# Patient Record
Sex: Female | Born: 1945 | Race: Black or African American | Smoking: Never smoker
Health system: Southern US, Community
[De-identification: ages and names within clinical notes are randomized; demographics above are authoritative.]

## PROBLEM LIST (undated history)

## (undated) DIAGNOSIS — I10 Essential (primary) hypertension: Secondary | ICD-10-CM

## (undated) DIAGNOSIS — I00 Rheumatic fever without heart involvement: Secondary | ICD-10-CM

## (undated) DIAGNOSIS — R011 Cardiac murmur, unspecified: Secondary | ICD-10-CM

## (undated) DIAGNOSIS — M1990 Unspecified osteoarthritis, unspecified site: Secondary | ICD-10-CM

## (undated) DIAGNOSIS — Z8619 Personal history of other infectious and parasitic diseases: Secondary | ICD-10-CM

## (undated) DIAGNOSIS — E785 Hyperlipidemia, unspecified: Secondary | ICD-10-CM

## (undated) HISTORY — DX: Hyperlipidemia, unspecified: E78.5

## (undated) HISTORY — DX: Rheumatic fever without heart involvement: I00

## (undated) HISTORY — DX: Personal history of other infectious and parasitic diseases: Z86.19

## (undated) HISTORY — DX: Unspecified osteoarthritis, unspecified site: M19.90

## (undated) HISTORY — DX: Essential (primary) hypertension: I10

## (undated) HISTORY — DX: Cardiac murmur, unspecified: R01.1

---

## 2015-05-07 LAB — CBC AND DIFFERENTIAL
HEMATOCRIT: 28 — AB (ref 36–46)
HEMOGLOBIN: 9.4 — AB (ref 12.0–16.0)
Platelets: 172 (ref 150–399)
WBC: 14.5

## 2015-05-07 LAB — BASIC METABOLIC PANEL: GLUCOSE: 75

## 2015-05-08 LAB — HEPATIC FUNCTION PANEL
ALT: 14 (ref 7–35)
AST: 19 (ref 13–35)
Alkaline Phosphatase: 139 — AB (ref 25–125)

## 2017-04-12 LAB — HM PAP SMEAR

## 2017-07-07 ENCOUNTER — Other Ambulatory Visit: Payer: Self-pay | Admitting: Physician Assistant

## 2017-07-07 ENCOUNTER — Ambulatory Visit
Admission: RE | Admit: 2017-07-07 | Discharge: 2017-07-07 | Disposition: A | Discharge status: Home / Self Care | Payer: Medicare Other | Source: Ambulatory Visit | Attending: Physician Assistant | Admitting: Physician Assistant

## 2017-07-07 DIAGNOSIS — M25569 Pain in unspecified knee: Secondary | ICD-10-CM

## 2017-07-07 DIAGNOSIS — M25519 Pain in unspecified shoulder: Secondary | ICD-10-CM

## 2017-10-17 ENCOUNTER — Ambulatory Visit: Payer: Medicare Other | Admitting: Sports Medicine

## 2017-10-27 ENCOUNTER — Ambulatory Visit: Payer: Self-pay

## 2017-10-27 ENCOUNTER — Encounter: Payer: Self-pay | Admitting: Sports Medicine

## 2017-10-27 ENCOUNTER — Ambulatory Visit (INDEPENDENT_AMBULATORY_CARE_PROVIDER_SITE_OTHER): Payer: Medicare Other | Admitting: Sports Medicine

## 2017-10-27 VITALS — BP 150/102 | HR 60 | Ht 65.5 in | Wt 180.2 lb

## 2017-10-27 DIAGNOSIS — M25512 Pain in left shoulder: Secondary | ICD-10-CM | POA: Diagnosis not present

## 2017-10-27 DIAGNOSIS — M19012 Primary osteoarthritis, left shoulder: Secondary | ICD-10-CM | POA: Diagnosis not present

## 2017-10-27 DIAGNOSIS — M1712 Unilateral primary osteoarthritis, left knee: Secondary | ICD-10-CM | POA: Diagnosis not present

## 2017-10-27 DIAGNOSIS — G8929 Other chronic pain: Secondary | ICD-10-CM | POA: Diagnosis not present

## 2017-10-27 NOTE — Progress Notes (Addendum)
[name]. Kayleanna Lorman, [place] [facility] [place]  Elzie Sheets - 72 y.o. female MRN [id number]  Date of birth: 10-May-1945  Visit Date: 10/27/2017  PCP: [name], DO   Referred by: [name], DO   Scribe(s) for today's visit: [name], LAT, ATC  SUBJECTIVE:  Monique [name] is here for New Patient (Initial Visit) (Knee pain)    HPI: 10/27/2017 Her L knee and L shoulder pain symptoms INITIALLY: Began about a year ago w/ no known MOI.  Pt reports pain more to the medial aspect of her L knee. Described as moderate (5-7/10) aching, intermittent  pain, radiating to L lower leg Worsened with walking Improved with CBD oil Additional associated symptoms include: no L knee swelling noted and no mechanical symptoms noted    At this time symptoms are worsening compared to onset  She has been using CBD oil.  L shoulder: Her L shoulder pain symptoms INITIALLY: Began years ago w/ no known MOI.  Pt notes that the pain has increased over the past year and now has pain in the L superior shoulder, radiating into the L neck.  She states that she had a period of time in which her L shoulder was "frozen" but notes that her ROM has improved since that time. Described as moderate, aching, constant pain, radiating to L side of her neck Worsened with overhead  ROM, pushing or pulling activities w/ L UE Improved with CBD oil Additional associated symptoms include: No significant clicking popping or locking.    At this time symptoms are showing no change compared to onset She has been using CBD oil.  REVIEW OF SYSTEMS: Reports night time disturbances. Reports fevers, chills, or night sweats.  Yes to night sweats. Denies unexplained weight loss. Denies personal history of cancer. Denies changes in bowel or bladder habits. Denies recent unreported falls. Denies new or worsening dyspnea or wheezing. Denies headaches or  dizziness.  Denies numbness, tingling or weakness  In the extremities.  Denies dizziness or presyncopal episodes Denies lower extremity edema    HISTORY:  Prior history reviewed and updated per electronic medical record.  Social History   Occupational History  . Not on file  Tobacco Use  . Smoking status: Never Smoker  . Smokeless tobacco: Never Used  Substance and Sexual Activity  . Alcohol use: Not on file  . Drug use: Not on file  . Sexual activity: Not on file   Social History   Social History Narrative  . Not on file    History reviewed. No pertinent past medical history.  family history is not on file.  DATA OBTAINED & REVIEWED:  No results for input(s): HGBA1C, LABURIC, CREATINE in the last 8760 hours. . 07/07/2017: o X-Ray left shoulder  Moderate AC joint arthritis and slight irregularity of the greater tuberosity suggestive of prior rotator cuff tendinopathy. o 2 view x-rays bilateral knees show mild tricompartmental degenerative changes of the right knee with a posterior loose body and severe tricompartmental degenerative changes of the left knee with subluxation of the tibia small joint effusion. o Lumbar spine normal-appearing lumbar spine with the exception of atherosclerosis noted.  Age-appropriate degenerative changes but no significant evidence of osseous or soft tissue irregularity. .   OBJECTIVE:  VS:  HT:5' 5.5" (166.4 cm)   WT:180 lb 3.2 oz (81.7 kg)  BMI:29.52    BP:(!) 150/102  HR:60bpm  TEMP: ( )  RESP:  PHYSICAL EXAM: CONSTITUTIONAL: Well-developed, Well-nourished and In no acute distress PSYCHIATRIC: Alert & appropriately interactive. and Not depressed or anxious appearing. RESPIRATORY: No increased work of breathing and Trachea Midline EYES: Pupils are equal., EOM intact without nystagmus. and No scleral icterus.  VASCULAR EXAM: Warm and well perfused NEURO: unremarkable  MSK Exam: Left shoulder  Well aligned, no significant  deformity. No overlying skin changes. TTP over Generalized tenderness to palpation over the anterior shoulder.   RANGE OF MOTION & STRENGTH  Limited and painful: Overhead range of motion, forward flexion and abduction.   SPECIALITY TESTING:  Positive Hawkins and Neer's but strength is well-preserved and only minimally painful.  She has more pain with O'Brien's testing and more pain with axial load and circumduction with positive crepitation    Left knee  . Marked degenerative change.  Osteoarthritic bossing.  Mild effusion with marked generalized synovitis. . TTP over Medial and lateral joint lines per . No overlying skin changes.   RANGE OF MOTION & STRENGTH  . Extensor mechanism intact.  Knee arc from 3degrees to 100 degrees   LIGAMENTOUS TESTING  . Ligamentously stable to Varus and valgus strain with only 3 to 4 mm of opening with varus testing but solid endpoint.   SPECIALITY TESTING:  . Positive/Abnormal: McMurray's, positive patellar grind.    ASSESSMENT   1. Chronic left shoulder pain   2. Primary osteoarthritis of left knee   3. Primary osteoarthritis of left shoulder     PLAN:  Pertinent additional documentation may be included in corresponding procedure notes, imaging studies, problem based documentation and patient instructions.  Procedures:  . US Guided Injection per procedure note . Discussed the foundation of treatment for this condition is physical therapy and/or daily (5-6 days/week) therapeutic exercises, focusing on core strengthening, coordination, neuromuscular control/reeducation.  Therapeutic exercises prescribed per procedure note.  Medications:  No orders of the defined types were placed in this encounter.  Discussion/Instructions: No problem-specific Assessment & Plan notes found for this encounter.  Marland Kitchen Ultrasound-guided injection today for both the shoulder and Neer performed. . Both of these joints being painful is related to underlying  osteoarthritic changes.  Further diagnostic evaluation not recommended at this time and can consider repeat injections. . Discussed red flag symptoms that warrant earlier emergent evaluation and patient voices understanding. . Activity modifications and the importance of avoiding exacerbating activities (limiting pain to no more than a 4 / 10 during or following activity) recommended and discussed.  Follow-up:  . Return in about 10 weeks (around 01/05/2018).   .   . At follow up will plan to consider: repeat corticosteroid injections and Visco-supplementation Discussed      CMA/ATC served as scribe during this visit. History, Physical, and Plan performed by medical provider. Documentation and orders reviewed and attested to.      Gerda Diss, [facility] Physician

## 2017-10-27 NOTE — Progress Notes (Signed)

## 2017-10-27 NOTE — Procedures (Signed)
PROCEDURE NOTE:  Ultrasound Guided: Injection: Left shoulder Images were obtained and interpreted by myself, Teresa Coombs, DO  Images have been saved and stored to PACS system. Images obtained on: GE S7 Ultrasound machine    ULTRASOUND FINDINGS:  Marked degenerative changes  DESCRIPTION OF PROCEDURE:  The patient's clinical condition is marked by substantial pain and/or significant functional disability. Other conservative therapy has not provided relief, is contraindicated, or not appropriate. There is a reasonable likelihood that injection will significantly improve the patient's pain and/or functional impairment.   After discussing the risks, benefits and expected outcomes of the injection and all questions were reviewed and answered, the patient wished to undergo the above named procedure.  Verbal consent was obtained.  The ultrasound was used to identify the target structure and adjacent neurovascular structures. The skin was then prepped in sterile fashion and the target structure was injected under direct visualization using sterile technique as below:  Single injection performed as below: PREP: Alcohol and Ethel Chloride APPROACH:superiolateral, single injection, 21g 2 in. INJECTATE: 2 cc 0.5% Marcaine and 1 cc 40mg /mL DepoMedrol ASPIRATE: None DRESSING: Band-Aid  Post procedural instructions including recommending icing and warning signs for infection were reviewed.    This procedure was well tolerated and there were no complications.   IMPRESSION: Succesful Ultrasound Guided: Injection

## 2017-10-27 NOTE — Patient Instructions (Signed)

## 2017-10-27 NOTE — Procedures (Signed)
PROCEDURE NOTE:  Ultrasound Guided: Injection: Left knee Images were obtained and interpreted by myself, Teresa Coombs, DO  Images have been saved and stored to PACS system. Images obtained on: GE S7 Ultrasound machine    ULTRASOUND FINDINGS:  osteoarthritic changes.  Small effusion.  DESCRIPTION OF PROCEDURE:  The patient's clinical condition is marked by substantial pain and/or significant functional disability. Other conservative therapy has not provided relief, is contraindicated, or not appropriate. There is a reasonable likelihood that injection will significantly improve the patient's pain and/or functional impairment.   After discussing the risks, benefits and expected outcomes of the injection and all questions were reviewed and answered, the patient wished to undergo the above named procedure.  Verbal consent was obtained.  The ultrasound was used to identify the target structure and adjacent neurovascular structures. The skin was then prepped in sterile fashion and the target structure was injected under direct visualization using sterile technique as below:  Single injection performed as below: PREP: Alcohol and Ethel Chloride APPROACH:posterior, single injection, 21g 2 in. INJECTATE: 2 cc 0.5% Marcaine and 2 cc 40mg /mL DepoMedrol ASPIRATE: None DRESSING: Band-Aid  Post procedural instructions including recommending icing and warning signs for infection were reviewed.    This procedure was well tolerated and there were no complications.   IMPRESSION: Succesful Ultrasound Guided: Injection

## 2017-12-07 LAB — HEPATIC FUNCTION PANEL
ALT: 33 (ref 7–35)
AST: 21 (ref 13–35)
Alkaline Phosphatase: 72 (ref 25–125)

## 2017-12-07 LAB — CBC AND DIFFERENTIAL
HEMATOCRIT: 33 — AB (ref 36–46)
HEMOGLOBIN: 10.8 — AB (ref 12.0–16.0)
PLATELETS: 294 (ref 150–399)
WBC: 6.9

## 2017-12-07 LAB — BASIC METABOLIC PANEL: Glucose: 84

## 2017-12-29 ENCOUNTER — Ambulatory Visit (INDEPENDENT_AMBULATORY_CARE_PROVIDER_SITE_OTHER): Payer: Medicare Other | Admitting: Sports Medicine

## 2017-12-29 ENCOUNTER — Encounter: Payer: Self-pay | Admitting: Sports Medicine

## 2017-12-29 ENCOUNTER — Ambulatory Visit (INDEPENDENT_AMBULATORY_CARE_PROVIDER_SITE_OTHER): Payer: Medicare Other

## 2017-12-29 VITALS — BP 158/88 | Ht 65.5 in | Wt 180.2 lb

## 2017-12-29 DIAGNOSIS — M25512 Pain in left shoulder: Secondary | ICD-10-CM

## 2017-12-29 DIAGNOSIS — M1712 Unilateral primary osteoarthritis, left knee: Secondary | ICD-10-CM

## 2017-12-29 DIAGNOSIS — M479 Spondylosis, unspecified: Secondary | ICD-10-CM | POA: Diagnosis not present

## 2017-12-29 DIAGNOSIS — M19012 Primary osteoarthritis, left shoulder: Secondary | ICD-10-CM | POA: Insufficient documentation

## 2017-12-29 DIAGNOSIS — G8929 Other chronic pain: Secondary | ICD-10-CM

## 2017-12-29 DIAGNOSIS — M19011 Primary osteoarthritis, right shoulder: Secondary | ICD-10-CM | POA: Diagnosis not present

## 2017-12-29 MED ORDER — CELECOXIB 100 MG PO CAPS: 100.0000 mg | ORAL_CAPSULE | Freq: Two times a day (BID) | ORAL | 2 refills | Status: DC | PRN

## 2017-12-29 NOTE — Progress Notes (Signed)
Monique [name]. , [facility] [place]  Monique [name] - 72 y.o. female MRN [id number]  Date of birth: October 05, 1945  Visit Date: 12/29/2017  PCP: [name], MD   Referred by: [name], DO   Scribe(s) for today's visit: [name], LAT, ATC  SUBJECTIVE:  Monique [name] is here for Follow-up (L shoulder and L knee pain)   HPI: 10/27/2017 Her L knee and L shoulder pain symptoms INITIALLY: Began about a year ago w/ no known MOI.  Pt reports pain more to the medial aspect of her L knee. Described as moderate (5-7/10) aching, intermittent  pain, radiating to L lower leg Worsened with walking Improved with CBD oil Additional associated symptoms include: no L knee swelling noted and no mechanical symptoms noted    At this time symptoms are worsening compared to onset  She has been using CBD oil.  L shoulder: Her L shoulder pain symptoms INITIALLY: Began years ago w/ no known MOI.  Pt notes that the pain has increased over the past year and now has pain in the L superior shoulder, radiating into the L neck.  She states that she had a period of time in which her L shoulder was "frozen" but notes that her ROM has improved since that time. Described as moderate, aching, constant pain, radiating to L side of her neck Worsened with overhead  ROM, pushing or pulling activities w/ L UE Improved with CBD oil Additional associated symptoms include: No significant clicking popping or locking.    At this time symptoms are showing no change compared to onset She has been using CBD oil.  12/29/2017: L knee Compared to the last office visit on 10/27/17, her previously described L knee pain symptoms are improving. Current symptoms are mild pain that comes and goes & are nonradiating She has been doing her HEP consisting of quad sets, SLR and hip aBd a few times/week .  She had a L knee steroid injection at her last visit  and feels that this was beneficial.  L shoulder Compared to the last office visit on 10/27/17, her previously described L shoulder symptoms are worsening and reports pain along the L side of her neck and along her L UT. Current symptoms are moderate, aching constant pain & are radiating to L neck and upper trap. She has been using CBD.  She had a L shoulder steroid injection at her last visit and feels that this did not help her symtpoms.  REVIEW OF SYSTEMS: Reports night time disturbances. Reports fevers, chills, or night sweats.  Yes to night sweats. Denies unexplained weight loss. Denies personal history of cancer. Denies changes in bowel or bladder habits. Denies recent unreported falls. Denies new or worsening dyspnea or wheezing. Denies headaches or dizziness.  Denies numbness, tingling or weakness  In the extremities.  Denies dizziness or presyncopal episodes Denies lower extremity edema    HISTORY:  Prior history reviewed and updated per electronic medical record.  Social History   Occupational History  . Not on file  Tobacco Use  . Smoking status: Never Smoker  . Smokeless tobacco: Never Used  Substance and Sexual Activity  . Alcohol use: Yes  . Drug use: Never  . Sexual activity: Yes   Social History   Social History Narrative  . Not on file    Past Medical History:  Diagnosis Date  . Arthritis   . Heart murmur   . History  of chicken pox   . Hyperlipidemia   . Hypertension   . Rheumatic fever     family history includes Alcohol abuse in her father and mother.  DATA OBTAINED & REVIEWED:   Recent Labs    01/25/18 1545  HGBA1C 5.9   . 07/07/2017: o X-Ray left shoulder  Moderate AC joint arthritis and slight irregularity of the greater tuberosity suggestive of prior rotator cuff tendinopathy. o 2 view x-rays bilateral knees show mild tricompartmental degenerative changes of the right knee with a posterior loose body and severe tricompartmental  degenerative changes of the left knee with subluxation of the tibia small joint effusion. o Lumbar spine normal-appearing lumbar spine with the exception of atherosclerosis noted.  Age-appropriate degenerative changes but no significant evidence of osseous or soft tissue irregularity. .   OBJECTIVE:  VS:  HT:5' 5.5" (166.4 cm)   WT:180 lb 3.2 oz (81.7 kg)  BMI:29.52    BP:(!) 158/88  HR: bpm  TEMP: ( )  RESP:    PHYSICAL EXAM: CONSTITUTIONAL: Well-developed, Well-nourished and In no acute distress PSYCHIATRIC: Alert & appropriately interactive. and Not depressed or anxious appearing. RESPIRATORY: No increased work of breathing and Trachea Midline EYES: Pupils are equal., EOM intact without nystagmus. and No scleral icterus.  VASCULAR EXAM: Warm and well perfused NEURO: unremarkable  MSK Exam: Cervical range of motion is slightly limited with side bending and rotation.  There is a small amount of pain with brachial plexus squeeze.  Her intrinsic rotator cuff strength is intact bilaterally.  Small amount of pain with axial load and circumduction of bilateral shoulders.  Slight AC joint pain with direct palpation.  Her knees are improved without significant effusion today.  Ligamentously stable.  Extensor mechanism intact.   Cervical spine x-rays obtained today that do show straightening of the cervical lordosis with mild multilevel degenerative changes.  ASSESSMENT   1. Primary osteoarthritis of both shoulders   2. Acute pain of left shoulder   3. Chronic left shoulder pain   4. Primary osteoarthritis of left knee      PROCEDURES:  None  PLAN:  Pertinent additional documentation may be included in corresponding procedure notes, imaging studies, problem based documentation and patient instructions.  She has better from the injections however is continuing to have generalized pain.  Some of this is likely coming from cervical spondylosis as well as generalized arthritis.     Prescription for Celebrex provided today.  Continue previously prescribed home exercise program.   Activity modifications and the importance of avoiding exacerbating activities (limiting pain to no more than a 4 / 10 during or following activity) recommended and discussed.  Discussed red flag symptoms that warrant earlier emergent evaluation and patient voices understanding.  Return in about 6 weeks (around 02/09/2018).     CMA/ATC served as Education administrator during this visit. History, Physical, and Plan performed by medical provider. Documentation and orders reviewed and attested to.      Gerda Diss, [facility] Physician

## 2018-01-19 ENCOUNTER — Ambulatory Visit: Payer: Medicare Other | Admitting: Family Medicine

## 2018-01-24 ENCOUNTER — Encounter: Payer: Self-pay | Admitting: Family Medicine

## 2018-01-24 ENCOUNTER — Ambulatory Visit (INDEPENDENT_AMBULATORY_CARE_PROVIDER_SITE_OTHER): Payer: Medicare Other | Admitting: Family Medicine

## 2018-01-24 VITALS — BP 156/104 | HR 76 | Temp 97.9°F | Ht 65.5 in | Wt 180.8 lb

## 2018-01-24 DIAGNOSIS — E559 Vitamin D deficiency, unspecified: Secondary | ICD-10-CM

## 2018-01-24 DIAGNOSIS — N959 Unspecified menopausal and perimenopausal disorder: Secondary | ICD-10-CM | POA: Diagnosis not present

## 2018-01-24 DIAGNOSIS — E782 Mixed hyperlipidemia: Secondary | ICD-10-CM | POA: Diagnosis not present

## 2018-01-24 DIAGNOSIS — I10 Essential (primary) hypertension: Secondary | ICD-10-CM

## 2018-01-24 DIAGNOSIS — E785 Hyperlipidemia, unspecified: Secondary | ICD-10-CM | POA: Insufficient documentation

## 2018-01-24 DIAGNOSIS — Z1231 Encounter for screening mammogram for malignant neoplasm of breast: Secondary | ICD-10-CM

## 2018-01-24 DIAGNOSIS — Z1211 Encounter for screening for malignant neoplasm of colon: Secondary | ICD-10-CM

## 2018-01-24 DIAGNOSIS — Z1159 Encounter for screening for other viral diseases: Secondary | ICD-10-CM

## 2018-01-24 LAB — LIPID PANEL
CHOL/HDL RATIO: 3
Cholesterol: 262 mg/dL — ABNORMAL HIGH (ref 0–200)
HDL: 88.5 mg/dL (ref >39.00)
LDL Cholesterol: 154 mg/dL — ABNORMAL HIGH (ref 0–99)
NONHDL: 173.27
Triglycerides: 97 mg/dL (ref 0.0–149.0)
VLDL: 19.4 mg/dL (ref 0.0–40.0)

## 2018-01-24 LAB — CBC WITH DIFFERENTIAL/PLATELET
BASOS PCT: 1 % (ref 0.0–3.0)
Basophils Absolute: 0 10*3/uL (ref 0.0–0.1)
EOS PCT: 2.9 % (ref 0.0–5.0)
Eosinophils Absolute: 0.1 10*3/uL (ref 0.0–0.7)
HEMATOCRIT: 43.3 % (ref 36.0–46.0)
HEMOGLOBIN: 14.6 g/dL (ref 12.0–15.0)
Lymphocytes Relative: 39.9 % (ref 12.0–46.0)
Lymphs Abs: 1.6 10*3/uL (ref 0.7–4.0)
MCHC: 33.7 g/dL (ref 30.0–36.0)
MCV: 91.8 fl (ref 78.0–100.0)
MONO ABS: 0.4 10*3/uL (ref 0.1–1.0)
Monocytes Relative: 10.8 % (ref 3.0–12.0)
NEUTROS ABS: 1.9 10*3/uL (ref 1.4–7.7)
Neutrophils Relative %: 45.4 % (ref 43.0–77.0)
PLATELETS: 204 10*3/uL (ref 150.0–400.0)
RBC: 4.72 Mil/uL (ref 3.87–5.11)
RDW: 14.6 % (ref 11.5–15.5)
WBC: 4.1 10*3/uL (ref 4.0–10.5)

## 2018-01-24 LAB — COMPREHENSIVE METABOLIC PANEL
ALBUMIN: 4 g/dL (ref 3.5–5.2)
ALT: 13 U/L (ref 0–35)
AST: 12 U/L (ref 0–37)
Alkaline Phosphatase: 68 U/L (ref 39–117)
BUN: 18 mg/dL (ref 6–23)
CHLORIDE: 106 meq/L (ref 96–112)
CO2: 28 meq/L (ref 19–32)
Calcium: 9.7 mg/dL (ref 8.4–10.5)
Creatinine, Ser: 0.9 mg/dL (ref 0.40–1.20)
GFR: 79.49 mL/min (ref >60.00)
Glucose, Bld: 109 mg/dL — ABNORMAL HIGH (ref 70–99)
POTASSIUM: 4.6 meq/L (ref 3.5–5.1)
SODIUM: 141 meq/L (ref 135–145)
Total Bilirubin: 0.5 mg/dL (ref 0.2–1.2)
Total Protein: 6.4 g/dL (ref 6.0–8.3)

## 2018-01-24 LAB — MICROALBUMIN / CREATININE URINE RATIO
Creatinine,U: 115.3 mg/dL
MICROALB UR: 0.7 mg/dL (ref 0.0–1.9)
Microalb Creat Ratio: 0.6 mg/g (ref 0.0–30.0)

## 2018-01-24 LAB — VITAMIN D 25 HYDROXY (VIT D DEFICIENCY, FRACTURES): VITD: 30.35 ng/mL (ref 30.00–100.00)

## 2018-01-24 LAB — TSH: TSH: 2.16 u[IU]/mL (ref 0.35–4.50)

## 2018-01-24 MED ORDER — AMLODIPINE BESYLATE 10 MG PO TABS: 10.0000 mg | ORAL_TABLET | Freq: Every day | ORAL | 3 refills | Status: AC

## 2018-01-24 NOTE — Patient Instructions (Signed)
Wean off metoprolol -take 1/2 pill daily for 2 weeks then 1/2 pill every other day for a week then stop.   We are going to start you on norvasc 10mg . I would start with 1/2 of a norvasc for 2 weeks and if blood pressure is still >140/90 would increase this to the full pill.    See you back in 1 month for recheck.

## 2018-01-24 NOTE — Progress Notes (Signed)
Patient: Monique [name] MRN: [id number] DOB: 1945-12-22 PCP: [name], MD     Subjective:  Chief Complaint  Patient presents with  . Establish Care  . Hypertension  . Hyperlipidemia    HPI: The patient is a 72 y.o. female who presents today for transfer of care and to follow up for chronic medical problems. She has medical history of HTN, vitamin D deficiency and hyperlipidemia. She   Hypertension: Here for follow up of hypertension.  Currently on metoprolol .  Home readings are 150/100. Takes medication as prescribed and denies any side effects. Exercise includes none, but is going to try to get back into first of year. Weight has been stable. Denies any chest pain, headaches, shortness of breath, vision changes, swelling in lower extremities.   Vitamin D deficiency: she had checked in July and it was still low. Only put on otc medication. Long hx of deficiency.   Hyperlipidemia: she has hx of elevated cholesterol. Can not tolerate statins. No family hx of CAD/heart disease. She does not smoke and does not have diabetes. She has not ever tried zetia.    Review of Systems  Constitutional: Positive for fatigue. Negative for chills and fever.  HENT: Negative for dental problem, ear pain, hearing loss and trouble swallowing.   Eyes: Negative for visual disturbance.  Respiratory: Negative for cough, chest tightness and shortness of breath.   Cardiovascular: Negative for chest pain, palpitations and leg swelling.  Gastrointestinal: Negative for abdominal pain, blood in stool, diarrhea and nausea.  Endocrine: Negative for cold intolerance, polydipsia, polyphagia and polyuria.  Genitourinary: Negative for dysuria and hematuria.  Musculoskeletal: Negative for arthralgias, back pain and neck pain.  Skin: Negative.  Negative for rash.  Neurological: Negative for dizziness and headaches.  Psychiatric/Behavioral: Negative for dysphoric mood and sleep disturbance. The patient is not  nervous/anxious.     Allergies Patient has No Known Allergies.  Past Medical History Patient  has a past medical history of Hyperlipidemia and Hypertension.  Surgical History Patient  has no past surgical history on file.  Family History Pateint's family history is not on file.  Social History Patient  reports that she has never smoked. She has never used smokeless tobacco.    Objective: Vitals:   01/24/18 0820 01/24/18 0900  BP: (!) 158/100 (!) 156/104  Pulse: 76   Temp: 97.9 F (36.6 C)   TempSrc: Oral   SpO2: 98%   Weight: 180 lb 12.8 oz (82 kg)   Height: 5' 5.5" (1.664 m)     Body mass index is 29.63 kg/m.  Physical Exam Vitals signs reviewed.  Constitutional:      Appearance: She is well-developed.  HENT:     Right Ear: External ear normal.     Left Ear: External ear normal.  Eyes:     Conjunctiva/sclera: Conjunctivae normal.     Pupils: Pupils are equal, round, and reactive to light.  Neck:     Musculoskeletal: Normal range of motion and neck supple.     Thyroid: No thyromegaly.  Cardiovascular:     Rate and Rhythm: Normal rate and regular rhythm.     Heart sounds: Normal heart sounds. No murmur.  Pulmonary:     Effort: Pulmonary effort is normal.     Breath sounds: Normal breath sounds.  Abdominal:     General: Bowel sounds are normal. There is no distension.     Palpations: Abdomen is soft.     Tenderness: There is no abdominal tenderness.  Lymphadenopathy:     Cervical: No cervical adenopathy.  Skin:    General: Skin is warm and dry.     Findings: No rash.  Neurological:     Mental Status: She is alert and oriented to person, place, and time.     Cranial Nerves: No cranial nerve deficit.     Coordination: Coordination normal.     Deep Tendon Reflexes: Reflexes normal.  Psychiatric:        Behavior: Behavior normal.    Depression screen PHQ 2/9 01/24/2018  Decreased Interest 0  Down, Depressed, Hopeless 0  PHQ - 2 Score 0        Assessment/plan: 1. Screening for colon cancer  - Ambulatory referral to Gastroenterology  2. Encounter for screening mammogram for breast cancer  - MM Digital Screening; Future  3. Essential hypertension She is way above goal. She has been on a beta blocker for years and is on low dose. Discussed this is 4th line and could be causing her fatigue. We are going to wean her off of this and start her on norvasc. She will f/u in one month. Routine lab work today. Side effects of medication discussed. Let me know if any issues.  - Microalbumin / creatinine urine ratio - Lipid panel - Comprehensive metabolic panel - CBC with Differential/Platelet - TSH  4. Mixed hyperlipidemia Will check today. She is fasting. Could do trial of zetia. Will calculate her ASCVD. - Lipid panel  5. Vitamin D deficiency  - VITAMIN D 25 Hydroxy (Vit-D Deficiency, Fractures)     6. Encounter for hepatitis C screening test for low risk patient  - Hepatitis C antibody  7. Menopausal and postmenopausal disorder  - DG Bone Density; Future   Return in about 1 month (around 02/24/2018) for htn check .    [name], MD [facility]   01/24/2018

## 2018-01-25 ENCOUNTER — Other Ambulatory Visit (INDEPENDENT_AMBULATORY_CARE_PROVIDER_SITE_OTHER): Payer: Medicare Other

## 2018-01-25 ENCOUNTER — Other Ambulatory Visit: Payer: Self-pay | Admitting: Family Medicine

## 2018-01-25 ENCOUNTER — Other Ambulatory Visit: Payer: Medicare Other

## 2018-01-25 DIAGNOSIS — R7309 Other abnormal glucose: Secondary | ICD-10-CM

## 2018-01-25 LAB — HEMOGLOBIN A1C: Hgb A1c MFr Bld: 5.9 % (ref 4.6–6.5)

## 2018-01-25 LAB — HEPATITIS C ANTIBODY
HEP C AB: NONREACTIVE
SIGNAL TO CUT-OFF: 0.03 (ref <1.00)

## 2018-01-26 ENCOUNTER — Encounter: Payer: Self-pay | Admitting: Family Medicine

## 2018-01-26 DIAGNOSIS — R7303 Prediabetes: Secondary | ICD-10-CM | POA: Insufficient documentation

## 2018-01-30 DIAGNOSIS — E669 Obesity, unspecified: Secondary | ICD-10-CM | POA: Insufficient documentation

## 2018-02-09 ENCOUNTER — Encounter: Payer: Self-pay | Admitting: Sports Medicine

## 2018-02-09 ENCOUNTER — Ambulatory Visit (INDEPENDENT_AMBULATORY_CARE_PROVIDER_SITE_OTHER): Payer: Medicare Other | Admitting: Sports Medicine

## 2018-02-09 VITALS — BP 124/88 | HR 84 | Ht 65.5 in | Wt 181.0 lb

## 2018-02-09 DIAGNOSIS — M19011 Primary osteoarthritis, right shoulder: Secondary | ICD-10-CM | POA: Diagnosis not present

## 2018-02-09 DIAGNOSIS — M1712 Unilateral primary osteoarthritis, left knee: Secondary | ICD-10-CM

## 2018-02-09 DIAGNOSIS — G8929 Other chronic pain: Secondary | ICD-10-CM

## 2018-02-09 DIAGNOSIS — M25512 Pain in left shoulder: Secondary | ICD-10-CM | POA: Diagnosis not present

## 2018-02-09 DIAGNOSIS — M19012 Primary osteoarthritis, left shoulder: Secondary | ICD-10-CM

## 2018-02-09 MED ORDER — CELECOXIB 100 MG PO CAPS: 100.0000 mg | ORAL_CAPSULE | Freq: Two times a day (BID) | ORAL | 2 refills | Status: DC | PRN

## 2018-02-09 NOTE — Progress Notes (Signed)
Monique [name]. , [place] at [place]  Monique [name] - 73 y.o. female MRN [id number]  Date of birth: 12-Jan-1946  Visit Date: 02/09/2018    PCP: [name], MD   Referred by: [name], DO   SUBJECTIVE:  Chief Complaint  Patient presents with  . f/u L shoulder    XR c-spine 12/29/2017, XR L shoulder 07/07/2017. Received L shoulder injection 10/27/2017. Taking Celebrex with goof relief.    HPI: Patient is here for follow-up after left intra-articular shoulder injection as above. She has had good improvement in the pain that she was having but continues to have difficulty with reaching back.  She continues to take Celebrex intermittently and is doing well with this.  She has shoulder pain is improved with rest and overhead reaching.  REVIEW OF SYSTEMS: Denies fevers, chills, recent weight gain or weight loss.  No night sweats. No significant nighttime awakenings due to this issue.  HISTORY:  Prior history reviewed and updated per electronic medical record.  Social History   Occupational History  . Not on file  Tobacco Use  . Smoking status: Never Smoker  . Smokeless tobacco: Never Used  Substance and Sexual Activity  . Alcohol use: Yes  . Drug use: Never  . Sexual activity: Yes   Social History   Social History Narrative  . Not on file      DATA OBTAINED & REVIEWED:  Recent Labs    12/07/17 01/24/18 0925 01/25/18 1545  HGBA1C  --   --  5.9  CALCIUM  --  9.7  --   AST 21 12  --   ALT 33 13  --   TSH  --  2.16  --    No problems updated. No specialty comments available.  OBJECTIVE:  VS:  HT:5' 5.5" (166.4 cm)   WT:181 lb (82.1 kg)  BMI:29.65    BP:124/88  HR:84bpm  TEMP: ( )  RESP:    PHYSICAL EXAM: Adult female.  No acute distress.  Alert and appropriate. She has improved overhead range of motion of the shoulders.  She has had decreased crepitation and there is improvement  with this with axial load and circumduction.  There is no pain with this motion. Intrinsic rotator cuff strength is intact.  Left knee has generalized osteoarthritic bossing but this is mild.  Ligamentously stable.  No significant effusion.   ASSESSMENT  1. Acute pain of left shoulder   2. Primary osteoarthritis of left knee   3. Primary osteoarthritis of left shoulder   4. Chronic left shoulder pain   5. Primary osteoarthritis of both shoulders     PLAN:  Pertinent additional documentation may be included in corresponding procedure notes, imaging studies, problem based documentation and patient instructions.  Procedures:  None  Medications:  Meds ordered this encounter  Medications  . celecoxib (CELEBREX) 100 MG capsule    Sig: Take 1 capsule (100 mg total) by mouth 2 (two) times daily as needed.    Dispense:  60 capsule    Refill:  2    Discussion/Instructions: No problem-specific Assessment & Plan notes found for this encounter.  She is done quite well with the Celebrex at this time and feels comfortable with continue with with this.  She understands that if she has any type of acute flareup and happy to reinject her knee at any time.  She has been using CBD oil as needed has been able  to cut back on this as well as the Celebrex with appropriate activity modifications.  Given the chronic use of NSAIDs and other comorbid conditions I am happy to follow this for her and will plan to follow-up in 6 months for CMET and CBC.  At follow up will plan: repeat corticosteroid injections Return in about 6 months (around 08/10/2018) for repeat blood work.          Gerda Diss, [facility] Physician

## 2018-02-18 ENCOUNTER — Encounter: Payer: Self-pay | Admitting: Sports Medicine

## 2018-02-26 ENCOUNTER — Ambulatory Visit: Payer: Medicare Other | Admitting: Family Medicine

## 2018-02-26 NOTE — Progress Notes (Deleted)
Patient: Monique [name] MRN: [id number] DOB: [birth year] PCP: [name], MD     Subjective:  No chief complaint on file.   HPI: The patient is a 73 y.o. female who presents today for ***  Review of Systems  Allergies Patient is allergic to atorvastatin.  Past Medical History Patient  has a past medical history of Arthritis, Heart murmur, History of chicken pox, Hyperlipidemia, Hypertension, and Rheumatic fever.  Surgical History Patient  has no past surgical history on file.  Family History Pateint's family history includes Alcohol abuse in her father and mother.  Social History Patient  reports that she has never smoked. She has never used smokeless tobacco. She reports current alcohol use. She reports that she does not use drugs.    Objective: There were no vitals filed for this visit.  There is no height or weight on file to calculate BMI.  Physical Exam     Assessment/plan:      No follow-ups on file.     [name], MD [place]  02/26/2018

## 2018-03-01 ENCOUNTER — Ambulatory Visit (INDEPENDENT_AMBULATORY_CARE_PROVIDER_SITE_OTHER): Payer: Medicare Other | Admitting: Family Medicine

## 2018-03-01 ENCOUNTER — Encounter: Payer: Self-pay | Admitting: Family Medicine

## 2018-03-01 ENCOUNTER — Encounter: Payer: Self-pay | Admitting: Sports Medicine

## 2018-03-01 ENCOUNTER — Ambulatory Visit (INDEPENDENT_AMBULATORY_CARE_PROVIDER_SITE_OTHER): Payer: Medicare Other | Admitting: Sports Medicine

## 2018-03-01 VITALS — BP 118/86 | HR 85 | Ht 65.5 in | Wt 179.6 lb

## 2018-03-01 VITALS — BP 118/86 | HR 85 | Temp 97.6°F | Ht 65.5 in | Wt 179.6 lb

## 2018-03-01 DIAGNOSIS — I10 Essential (primary) hypertension: Secondary | ICD-10-CM | POA: Diagnosis not present

## 2018-03-01 DIAGNOSIS — G8929 Other chronic pain: Secondary | ICD-10-CM

## 2018-03-01 DIAGNOSIS — M25561 Pain in right knee: Principal | ICD-10-CM

## 2018-03-01 DIAGNOSIS — E782 Mixed hyperlipidemia: Secondary | ICD-10-CM | POA: Diagnosis not present

## 2018-03-01 DIAGNOSIS — M25562 Pain in left knee: Principal | ICD-10-CM

## 2018-03-01 DIAGNOSIS — M170 Bilateral primary osteoarthritis of knee: Secondary | ICD-10-CM | POA: Diagnosis not present

## 2018-03-01 MED ORDER — CELECOXIB 100 MG PO CAPS: 100.0000 mg | ORAL_CAPSULE | Freq: Two times a day (BID) | ORAL | 2 refills | Status: DC | PRN

## 2018-03-01 NOTE — Progress Notes (Signed)
Patient: Monique [name] MRN: [id number] DOB: November 15, 1945 PCP: [name], MD     Subjective:  Chief Complaint  Patient presents with  . Hypertension    HPI: The patient is a 73 y.o. female who presents today for htn f/u.   Hypertension: Here for follow up of hypertension.  Currently on norvasc . Home readings range from 956-387 FIEPPIRJ/18-84 diastolic. Takes medication as prescribed and denies any side effects. Exercise includes nothing. Weight has been stable. Denies any chest pain, headaches, shortness of breath, vision changes, swelling in lower extremities. No problems with medication.   Hyperlipidemia: ascvd risk is 22%. Declines statin. She is taking an over the counter supplement and plans to start exercising. We will recheck her lipids at next visit. Risks discussed.   Prediabetes: a1c of 5.9 at last visit. At her age, im not overly concerned, but discussed we need to monitor q 6 months and she is going to start exercising.   Review of Systems  Constitutional: Positive for fatigue.  Eyes: Negative for visual disturbance.  Respiratory: Negative for shortness of breath.   Cardiovascular: Negative for chest pain.  Gastrointestinal: Negative for abdominal pain and nausea.  Neurological: Negative for dizziness and headaches.  Psychiatric/Behavioral: Negative for sleep disturbance. The patient is not nervous/anxious.     Allergies Patient is allergic to atorvastatin.  Past Medical History Patient  has a past medical history of Arthritis, Heart murmur, History of chicken pox, Hyperlipidemia, Hypertension, and Rheumatic fever.  Surgical History Patient  has no past surgical history on file.  Family History Pateint's family history includes Alcohol abuse in her father and mother.  Social History Patient  reports that she has never smoked. She has never used smokeless tobacco. She reports current alcohol use. She reports that she does not use drugs.     Objective: Vitals:   03/01/18 0953  BP: 118/86  Pulse: 85  Temp: 97.6 F (36.4 C)  TempSrc: Oral  SpO2: 99%  Weight: 179 lb 9.6 oz (81.5 kg)  Height: 5' 5.5" (1.664 m)    Body mass index is 29.43 kg/m.  Physical Exam Vitals signs reviewed.  Constitutional:      Appearance: Normal appearance.  Neck:     Musculoskeletal: Normal range of motion and neck supple.  Cardiovascular:     Rate and Rhythm: Normal rate and regular rhythm.     Heart sounds: Normal heart sounds.  Pulmonary:     Effort: Pulmonary effort is normal.     Breath sounds: Normal breath sounds.  Abdominal:     General: Abdomen is flat. Bowel sounds are normal.     Palpations: Abdomen is soft.  Neurological:     General: No focal deficit present.     Mental Status: She is alert and oriented to person, place, and time.       Assessment/plan: 1. Essential hypertension Blood pressure is to goal. Continue current anti-hypertensive medications. Will see her back in 8 months when I get back from maternity leave instead of 6 months. She has enough medication. Will check labs at that time. Let me know if any issues. Encouraged her to start her exercise as well. Otherwise she is doing great.   2. Mixed hyperlipidemia Will check fasting cholesterol at f/u. She will start exercising. Discussed her 22% risk is calculated by other factors than just her cholesterol numbers and will likely still be high on recheck due to age/sex and HTN, but hopefully we can improve numbers with lifestyle modifications./ She does  not want statin.    Return in about 8 months (around 10/31/2018) for htn/annual/lipid f/u (after maternity leave).   [name], MD [facility]   03/01/2018

## 2018-03-01 NOTE — Progress Notes (Signed)
  Monique [name]. Monique [name], [place] at [place]  Monique [name] - 73 y.o. female MRN [id number]  Date of birth: 1945-11-14  Visit Date: March 03, 2018  PCP: [name], MD   Referred by: [name], MD  SUBJECTIVE:  Chief Complaint  Patient presents with  . Right Knee - Initial Assessment    HPI: Patient presents with worsening right knee pain.  She has had issues with her right knee as well as her left for quite some time but the right knee tends to be less symptomatic for.  She does report an episode where she went from sitting to standing on Monday and had an acute onset of pain and discomfort.  It has only minimally lessened since that occurred.  She is taking CBD oil intermittently provement.  She is not been taking the Celebrex previously prescribed she reports out of it.  No other anti-inflammatories.   REVIEW OF SYSTEMS: Per HPI  HISTORY:  Prior history reviewed and updated per electronic medical record.  Social History   Occupational History  . Not on file  Tobacco Use  . Smoking status: Never Smoker  . Smokeless tobacco: Never Used  Substance and Sexual Activity  . Alcohol use: Yes  . Drug use: Never  . Sexual activity: Yes   Social History   Social History Narrative  . Not on file    OBJECTIVE:  VS:  HT:5' 5.5" (166.4 cm)   WT:179 lb 9.6 oz (81.5 kg)  BMI:29.42    BP:118/86  HR:85bpm  TEMP: ( )  RESP:99 %   PHYSICAL EXAM: Adult female. No acute distress.  Alert and appropriate. Bilateral knees overall well aligned without significant effusion.  No edema.  Ligamentously stable.  Mild patellar grind.  Slight pain with palpation of the medial and lateral joint lines.   ASSESSMENT   1. Chronic pain of both knees   2. Primary osteoarthritis of both knees     PROCEDURES:  None  PLAN:  Pertinent additional documentation may be included in corresponding procedure notes, imaging studies,  problem based documentation and patient instructions.  Right knee with acute flareup of medial knee pain that is diffuse.  She should benefit from Celebrex as this been helpful for the left knee and has not currently tried this recently. Does have some degenerative changes noted on x-ray but this is much less severe than on the left..  Can consider injections If any lack of improvement will plan to follow-up with Ortho discussed joint replacement surgery  Activity modifications and the importance of avoiding exacerbating activities (limiting pain to no more than a 4 / 10 during or following activity) recommended and discussed. Discussed red flag symptoms that warrant earlier emergent evaluation and patient voices understanding.  Meds ordered this encounter  Medications  . celecoxib (CELEBREX) 100 MG capsule    Sig: Take 1 capsule (100 mg total) by mouth 2 (two) times daily as needed.    Dispense:  60 capsule    Refill:  2    Lab Orders  No laboratory test(s) ordered today   Imaging Orders  No imaging studies ordered today   Referral Orders  No referral(s) requested today    Return for previously scheduled follow-up visit.Gerda Diss, [facility] Physician

## 2018-03-11 ENCOUNTER — Encounter: Payer: Self-pay | Admitting: Sports Medicine

## 2018-05-25 ENCOUNTER — Ambulatory Visit (INDEPENDENT_AMBULATORY_CARE_PROVIDER_SITE_OTHER): Payer: Medicare Other | Admitting: Sports Medicine

## 2018-05-25 ENCOUNTER — Other Ambulatory Visit: Payer: Self-pay

## 2018-05-25 ENCOUNTER — Ambulatory Visit: Payer: Self-pay

## 2018-05-25 ENCOUNTER — Encounter: Payer: Self-pay | Admitting: Sports Medicine

## 2018-05-25 VITALS — BP 120/78 | HR 89 | Temp 98.3°F | Ht 65.5 in | Wt 181.6 lb

## 2018-05-25 DIAGNOSIS — M25561 Pain in right knee: Secondary | ICD-10-CM | POA: Diagnosis not present

## 2018-05-25 DIAGNOSIS — M25562 Pain in left knee: Secondary | ICD-10-CM | POA: Diagnosis not present

## 2018-05-25 DIAGNOSIS — M179 Osteoarthritis of knee, unspecified: Secondary | ICD-10-CM | POA: Insufficient documentation

## 2018-05-25 DIAGNOSIS — M170 Bilateral primary osteoarthritis of knee: Secondary | ICD-10-CM

## 2018-05-25 DIAGNOSIS — M1710 Unilateral primary osteoarthritis, unspecified knee: Secondary | ICD-10-CM | POA: Insufficient documentation

## 2018-05-25 DIAGNOSIS — G8929 Other chronic pain: Secondary | ICD-10-CM | POA: Diagnosis not present

## 2018-05-25 NOTE — Patient Instructions (Signed)

## 2018-05-25 NOTE — Assessment & Plan Note (Signed)
Bilateral knee injections performed today. We will get her prior authorized/preapproved for Zilretta injections

## 2018-05-25 NOTE — Progress Notes (Signed)
Monique [name]. Monique [name], [place] at [place]  Monique [name] - 73 y.o. female MRN [id number]  Date of birth: 1945/04/02  Visit Date: May 25, 2018  PCP: [name], MD   Referred by: [name], MD  SUBJECTIVE:   Chief Complaint  Patient presents with  . Follow-up    Knee pain.  L knee injection - 10/27/17.  Celebrex.  CBD oil.  HEP    HPI: Left knee began to hurt again several weeks ago with now both knees hurting.  She is requesting repeat injections today. Using treatments above.  Had good but incomplete relief with last injections.  Interested in [place] injections as her sister has done remarkably well with these.   REVIEW OF SYSTEMS: Denies fevers, chills, recent weight gain or weight loss.  No night sweats.  Pt denies any change in bowel or bladder habits, muscle weakness, numbness or falls associated with this pain. Some issues with both sleep maintenance and onset due to these issues  HISTORY:  Prior history reviewed and updated per electronic medical record.  Patient Active Problem List   Diagnosis Date Noted  . Knee osteoarthritis 05/25/2018  . Obesity (BMI 35.0-39.9 without comorbidity) 01/30/2018  . Prediabetes 01/26/2018    dx'd 01/2018. a1c of 5.9    . Essential hypertension 01/24/2018  . Hyperlipidemia 01/24/2018    Can not tolerate statins   . Vitamin D deficiency 01/24/2018  . Primary osteoarthritis of left shoulder 12/29/2017   Social History   Occupational History  . Not on file  Tobacco Use  . Smoking status: Never Smoker  . Smokeless tobacco: Never Used  Substance and Sexual Activity  . Alcohol use: Yes  . Drug use: Never  . Sexual activity: Yes   Social History   Social History Narrative  . Not on file     OBJECTIVE:  VS:  HT:5' 5.5" (166.4 cm)   WT:181 lb 9.6 oz (82.4 kg)  BMI:29.75    BP:120/78  HR:89bpm  TEMP:98.3 F (36.8 C)( )  RESP:96 %   PHYSICAL EXAM:  Adult female. No acute distress.  Alert and appropriate. Mild to moderate osteoarthritic bossing with mild to moderate synovitis and no significant effusions.  Her knees are ligamentously stable.  Extensor mechanism is intact.  Bilateral medial joint line pain is worse than lateral.   ASSESSMENT:   1. Chronic pain of both knees   2. Primary osteoarthritis of both knees     PROCEDURES:  US Guided Injection per procedure note      PLAN:  Pertinent additional documentation may be included in corresponding procedure notes, imaging studies, problem based documentation and patient instructions.  Knee osteoarthritis Bilateral knee injections performed today. We will get her prior authorized/preapproved for Zilretta injections   We will go ahead and inject her knees with standard corticosteroid today and preapproved her for Zilretta can be performed in 8 to 10 weeks.  Home Therapeutic Exercises: Continue previously prescribed home exercise program    Activity modifications and the importance of avoiding exacerbating activities (limiting pain to no more than a 4 / 10 during or following activity) recommended and discussed.   Discussed red flag symptoms that warrant earlier emergent evaluation and patient voices understanding.    No orders of the defined types were placed in this encounter.  Lab Orders  No laboratory test(s) ordered today    Imaging Orders     [place] MSK POCT ULTRASOUND Referral Orders  No referral(s) requested today    Return if symptoms worsen or fail to improve, for B knee Zilretta injections at least 10 weeks since last visit.          Gerda Diss, [facility] Physician

## 2018-05-28 ENCOUNTER — Encounter: Payer: Self-pay | Admitting: Sports Medicine

## 2018-05-28 NOTE — Procedures (Signed)
PROCEDURE NOTE:  Ultrasound Guided: Injection: Bilateral knee Images were obtained and interpreted by myself, Teresa Coombs, DO  Images have been saved and stored to PACS system. Images obtained on: GE S7 Ultrasound machine    ULTRASOUND FINDINGS:  Small effusions.  Moderate degenerative spurring.  Mild to moderate synovitis  DESCRIPTION OF PROCEDURE:  The patient's clinical condition is marked by substantial pain and/or significant functional disability. Other conservative therapy has not provided relief, is contraindicated, or not appropriate. There is a reasonable likelihood that injection will significantly improve the patient's pain and/or functional impairment.   After discussing the risks, benefits and expected outcomes of the injection and all questions were reviewed and answered, the patient wished to undergo the above named procedure.  Verbal consent was obtained.  The ultrasound was used to identify the target structure and adjacent neurovascular structures. The skin was then prepped in sterile fashion and the target structure was injected under direct visualization using sterile technique as below:  Bilateral injections performed under identical technique as below: PREP: Alcohol and Ethel Chloride APPROACH:superiolateral, single injection, 25g 1.5 in. INJECTATE: 2 cc 0.5% Marcaine and 1 cc 40mg /mL DepoMedrol ASPIRATE: None DRESSING: Band-Aid  Post procedural instructions including recommending icing and warning signs for infection were reviewed.    This procedure was well tolerated and there were no complications.   IMPRESSION: Succesful Ultrasound Guided: Injection

## 2018-07-20 ENCOUNTER — Telehealth: Payer: Self-pay | Admitting: Family Medicine

## 2018-07-20 NOTE — Telephone Encounter (Signed)
Patient called on 07/20/18 to schedule a Zilretta Injection with Dr. Paulla Fore. I scheduled pt for 08/01/18 with Dr. Tamala Julian at 2:30pm but patient is requesting to come in with her sister at the time of her appt on 08/01/18 at 9:15am. Please call and advise pt if this would be alright or not, and patient would also like clarification that medication will be on hand at the office for her at the time of her appt. Thank you!

## 2018-07-23 NOTE — Telephone Encounter (Signed)
Left message for patient to call back to discuss injection and appointment time.

## 2018-08-01 ENCOUNTER — Other Ambulatory Visit: Payer: Self-pay

## 2018-08-01 ENCOUNTER — Encounter: Payer: Self-pay | Admitting: Family Medicine

## 2018-08-01 ENCOUNTER — Ambulatory Visit: Payer: Medicare Other | Admitting: Family Medicine

## 2018-08-01 ENCOUNTER — Ambulatory Visit (INDEPENDENT_AMBULATORY_CARE_PROVIDER_SITE_OTHER): Payer: Medicare Other | Admitting: Family Medicine

## 2018-08-01 DIAGNOSIS — M170 Bilateral primary osteoarthritis of knee: Secondary | ICD-10-CM | POA: Diagnosis not present

## 2018-08-01 NOTE — Assessment & Plan Note (Signed)
Bilateral injections given today.  Tolerated the procedure well.  Discussed icing regimen and home exercise.  Discussed which activities of doing which wants to avoid. Discussed possible visco  RTC in 6 weeks

## 2018-08-01 NOTE — Patient Instructions (Signed)
Good to see you.  Ice 20 minutes 2 times daily. Usually after activity and before bed. Exercises 3 times a week.  voltaren gel 2 times a day  Gabapentin 200 mg at night  Tart cherry extract 1200mg  at night Vitamin D 2000 IU daily  Spenco orthotics "total support" online would be great  See me again in 6 weeks and will do gel injections if we need

## 2018-08-01 NOTE — Progress Notes (Signed)
[facility] [address], [address] [address] Phone: [phone] Subjective:   Monique Monique [name], am serving as a scribe for Dr. Hulan Saas.   CC: Bilateral knee pain  JJK:[id number]  Monique Monique [name] is a 73 y.o. female coming in with complaint of bilateral knee pain. Intermittent pain. Feels stiff. Has increase in pain with deep knee bending.  Patient was seen by another provider and diagnosed with arthritis.  Patient describes the pain as a dull, throbbing aching sensation.  Rates the severity of pain is 8 out of 10.  Monique [name]  Failed conservative therapy    Past Medical History:  Diagnosis Date  . Arthritis   . Heart murmur   . History of chicken pox   . Hyperlipidemia   . Hypertension   . Rheumatic fever    Monique [name] Social History   Socioeconomic History  . Marital status: Legally Separated    Spouse name: Not on file  . Number of children: Not on file  . Years of education: Not on file  . Highest education level: Not on file  Occupational History  . Not on file  Social Needs  . Financial resource strain: Not on file  . Food insecurity    Worry: Not on file    Inability: Not on file  . Transportation needs    Medical: Not on file    Non-medical: Not on file  Tobacco Use  . Smoking status: Never Smoker  . Smokeless tobacco: Never Used  Substance and Sexual Activity  . Alcohol use: Yes  . Drug use: Never  . Sexual activity: Yes  Lifestyle  . Physical activity    Days per week: Not on file    Minutes per session: Not on file  . Stress: Not on file  Relationships  . Social Herbalist on phone: Not on file    Gets together: Not on file    Attends religious service: Not on file    Active member of club or organization: Not on file    Attends meetings of clubs or organizations: Not on file    Relationship status: Not on file  Other Topics Concern  . Not on file   Social History Narrative  . Not on file   Allergies  Allergen Reactions  . Atorvastatin Other (See Comments)    Muscle aches   Family History  Problem Relation Age of Onset  . Alcohol abuse Mother   . Alcohol abuse Father      Current Outpatient Medications (Cardiovascular):  .  amLODipine (NORVASC) 10 MG tablet, Take 1 tablet (10 mg total) by mouth daily.   Current Outpatient Medications (Analgesics):  .  celecoxib (CELEBREX) 100 MG capsule, Take 1 capsule (100 mg total) by mouth 2 (two) times daily as needed.   Current Outpatient Medications (Other):  .  cholecalciferol (VITAMIN D) 400 units TABS tablet, Take 400 Units by mouth 2 (two) times daily. Marland Kitchen  OVER THE COUNTER MEDICATION, Pt takes OTC supplement of "Cholesterol 360" daily    Past medical history, social, surgical and family history all reviewed in electronic medical record.  Monique [name]   Review of Systems:  Monique [name], visual changes, nausea, vomiting, diarrhea, constipation, dizziness, abdominal pain, skin rash, fevers, chills, night sweats, weight loss, swollen lymph nodes, body aches,  chest pain, shortness of breath, mood changes.  Positive  muscle aches, joint swelling  Objective  Blood pressure 118/82, pulse 88, height 5' 5.5" (1.664 m), SpO2 98 %.    General: Monique [name], dressed appropriately.  HEENT: Pupils equal, extraocular movements intact  Respiratory: Patient's speak in full sentences and does not appear short of breath  Cardiovascular: Trace lower extremity edema, non tender, Monique [name]  Skin: Warm dry intact with Monique [name]  Abdomen: Soft nontender  Neuro: Cranial nerves II through XII are intact, neurovascularly intact in all extremities with 2+ DTRs and 2+ pulses.  Lymph: Monique [name]  Gait severely antalgic MSK:  tender with full range of motion and good stability and symmetric strength and tone of shoulders, elbows, wrist, hip, and ankles bilaterally.  Knee: Bilateral valgus deformity noted.  Abnormal thigh to calf ratio.  Tender to palpation over medial and PF joint line.  ROM full in flexion and extension and lower leg rotation. instability with valgus force.  painful patellar compression. Patellar glide with moderate crepitus. Patellar and quadriceps tendons unremarkable. Hamstring and quadriceps strength is normal.  After informed written and verbal consent, patient was seated on exam table. Right knee was prepped with alcohol swab and utilizing anterolateral approach, patient's right knee space was injected with 4:1  marcaine 0.5%: zilretta . Patient tolerated the procedure well without immediate complications.  After informed written and verbal consent, patient was seated on exam table. Left knee was prepped with alcohol swab and utilizing anterolateral approach, patient's left knee space was injected with 4:1  marcaine 0.5%: zilretta L. Patient tolerated the procedure well without immediate complications.   Impression and Recommendations:     This case required medical decision making of moderate complexity. The above documentation has been reviewed and is accurate and complete Monique Pulley, DO       Note: This dictation was prepared with Dragon dictation along with smaller phrase technology. Any transcriptional errors that result from this process are unintentional.

## 2018-08-14 ENCOUNTER — Ambulatory Visit: Payer: Medicare Other | Admitting: Sports Medicine

## 2018-08-31 ENCOUNTER — Other Ambulatory Visit: Payer: Self-pay

## 2018-08-31 ENCOUNTER — Ambulatory Visit: Payer: Medicare Other | Admitting: Family Medicine

## 2018-08-31 ENCOUNTER — Encounter: Payer: Self-pay | Admitting: Family Medicine

## 2018-08-31 ENCOUNTER — Ambulatory Visit (INDEPENDENT_AMBULATORY_CARE_PROVIDER_SITE_OTHER): Payer: Medicare Other | Admitting: Family Medicine

## 2018-08-31 VITALS — BP 122/86 | HR 90 | Temp 99.0°F | Ht 65.5 in | Wt 184.4 lb

## 2018-08-31 DIAGNOSIS — R202 Paresthesia of skin: Secondary | ICD-10-CM | POA: Diagnosis not present

## 2018-08-31 DIAGNOSIS — R739 Hyperglycemia, unspecified: Secondary | ICD-10-CM

## 2018-08-31 NOTE — Progress Notes (Signed)
Phone [phone]   Subjective:  Monique [name] is a 73 y.o. year old very pleasant female patient who presents for/with See problem oriented charting Chief Complaint  Patient presents with  . Numbness    2weeks "off and on" sx of numbness and tingling in tips of fingers and feet.   ROS- no fever/chills/cough/sore throat reported. No weakness in legs reported.    Past Medical History-  Patient Active Problem List   Diagnosis Date Noted  . Knee osteoarthritis 05/25/2018  . Obesity (BMI 35.0-39.9 without comorbidity) 01/30/2018  . Prediabetes 01/26/2018  . Essential hypertension 01/24/2018  . Hyperlipidemia 01/24/2018  . Vitamin D deficiency 01/24/2018  . 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 amLODipine (NORVASC) 10 MG tablet Take 1 tablet (10 mg total) by mouth daily. 90 tablet 3  . celecoxib (CELEBREX) 100 MG capsule Take 1 capsule (100 mg total) by mouth 2 (two) times daily as needed. 60 capsule 2   No current facility-administered medications for this visit.      Objective:  BP 122/86   Pulse 90   Temp 99 F (37.2 C) (Oral)   Ht 5' 5.5" (1.664 m)   Wt 184 lb 6.4 oz (83.6 kg)   LMP  (LMP Unknown)   SpO2 100%   BMI 30.22 kg/m  Gen: NAD, resting comfortably CV: RRR no murmurs rubs or gallops Lungs: CTAB no crackles, wheeze, rhonchi Abdomen: soft/nontender/nondistended/normal bowel sounds.  Skin: warm, dry Neuro: no leg weakness, normal lower extremity reflexes. Normal monofilament testing    Assessment and Plan   # Numbness in Both Feet/paresthesias/hyperglycemia S:Pt has hx of HTN, prediabetes, and chronic LBP. Had XR L-spine 07/07/2017 which was relatively normal, did show atherosclerosis.   Started with feet tingling sensation in both feet off and on for a few months. Sparingly will note mild numbness in hands- shakes hands out and symptoms  improve. No weakness in the legs. Just bottom of feet- doesn't go up leg. No pain. Usually tingling is less than 5 minutes when it occurs- has only been severe once- happened in morning after she got up to go to the bathroom- but still only lasted a few months. Prior to this last year never had issues. Usually 2 or less alcoholic beverages per month  Prior a1c in prediabetes/insulin resistance phase A/P: paresthesias of unclear cause- she would like to update her labs today- will look for any obvious cause with labs. Advised follow up with PCP if no clear cause found. Could come from lumbar spine but last x-ray pretty reassuring and not clearly radiculopathy (could get Dr. Thompson Caul opinion when he sees her 09/12/2018) . Check a1c to make sure hasnt progressed to diabetes  Recommended follow up: prn for this acute issue Future Appointments  Date Time Provider [place]  09/12/2018  9:15 AM Lyndal Pulley, DO [id number]    Lab/Order associations:   ICD-10-CM   1. Paresthesias  R20.2 TSH    Vitamin B12    Hemoglobin A1c    Comprehensive metabolic panel    CBC    CBC    Comprehensive metabolic panel    Hemoglobin A1c    Vitamin B12    TSH    CANCELED: CBC    CANCELED: Comprehensive metabolic panel    CANCELED: Hemoglobin A1c    CANCELED: Vitamin B12    CANCELED: TSH  2. Hyperglycemia  R73.9 Hemoglobin A1c  Hemoglobin A1c    CANCELED: Hemoglobin A1c   Return precautions advised.  Garret Reddish, MD

## 2018-08-31 NOTE — Patient Instructions (Addendum)
Please stop by lab before you go If you do not have mychart- we will call you about results within 5 business days of [place] receiving them.  If you have mychart- we will send your results within 3 business days of [place] receiving them.  If abnormal or we want to clarify a result, we will call or mychart you to make sure you receive the message.  If you have questions or concerns or don't hear within 5-7 days, please send [place] a message or call us.   Would strongly advise you call and get your yearly mammogram- have them send [place] a copy after you complete it

## 2018-09-01 LAB — COMPREHENSIVE METABOLIC PANEL
AG Ratio: 1.7 (calc) (ref 1.0–2.5)
ALT: 13 U/L (ref 6–29)
AST: 11 U/L (ref 10–35)
Albumin: 4.1 g/dL (ref 3.6–5.1)
Alkaline phosphatase (APISO): 76 U/L (ref 37–153)
BUN/Creatinine Ratio: 14 (calc) (ref 6–22)
BUN: 13 mg/dL (ref 7–25)
CO2: 28 mmol/L (ref 20–32)
Calcium: 9.5 mg/dL (ref 8.6–10.4)
Chloride: 104 mmol/L (ref 98–110)
Creat: 0.95 mg/dL — ABNORMAL HIGH (ref 0.60–0.93)
Globulin: 2.4 g/dL (calc) (ref 1.9–3.7)
Glucose, Bld: 72 mg/dL (ref 65–99)
Potassium: 3.5 mmol/L (ref 3.5–5.3)
Sodium: 141 mmol/L (ref 135–146)
Total Bilirubin: 0.5 mg/dL (ref 0.2–1.2)
Total Protein: 6.5 g/dL (ref 6.1–8.1)

## 2018-09-01 LAB — VITAMIN B12: Vitamin B-12: 818 pg/mL (ref 200–1100)

## 2018-09-01 LAB — HEMOGLOBIN A1C
Hgb A1c MFr Bld: 6 % of total Hgb — ABNORMAL HIGH (ref <5.7)
Mean Plasma Glucose: 126 (calc)
eAG (mmol/L): 7 (calc)

## 2018-09-01 LAB — CBC
HCT: 44.3 % (ref 35.0–45.0)
Hemoglobin: 15.4 g/dL (ref 11.7–15.5)
MCH: 31.3 pg (ref 27.0–33.0)
MCHC: 34.8 g/dL (ref 32.0–36.0)
MCV: 90 fL (ref 80.0–100.0)
MPV: 9.7 fL (ref 7.5–12.5)
Platelets: 229 10*3/uL (ref 140–400)
RBC: 4.92 10*6/uL (ref 3.80–5.10)
RDW: 13.6 % (ref 11.0–15.0)
WBC: 5.6 10*3/uL (ref 3.8–10.8)

## 2018-09-01 LAB — TSH: TSH: 1.19 mIU/L (ref 0.40–4.50)

## 2018-09-07 LAB — HM MAMMOGRAPHY

## 2018-09-12 ENCOUNTER — Encounter: Payer: Self-pay | Admitting: Family Medicine

## 2018-09-12 ENCOUNTER — Ambulatory Visit (INDEPENDENT_AMBULATORY_CARE_PROVIDER_SITE_OTHER): Payer: Medicare Other | Admitting: Family Medicine

## 2018-09-12 ENCOUNTER — Other Ambulatory Visit: Payer: Self-pay

## 2018-09-12 ENCOUNTER — Ambulatory Visit: Payer: Medicare Other | Admitting: Family Medicine

## 2018-09-12 DIAGNOSIS — M170 Bilateral primary osteoarthritis of knee: Secondary | ICD-10-CM | POA: Diagnosis not present

## 2018-09-12 NOTE — Assessment & Plan Note (Signed)
Doing better at this time.  Discussed icing regimen and home exercise.  No change in management.  Patient wants to hold on any viscosupplementation

## 2018-09-12 NOTE — Progress Notes (Signed)
[facility] [address], [place] [address] Phone: [phone] Subjective:   I Monique [name] am serving as a Education administrator for Dr. Hulan Saas.  I'm seeing this patient by the request  of:    CC: Bilateral knee pain  [id number]   08/01/2018 Bilateral injections given today.  Tolerated the procedure well.  Discussed icing regimen and home exercise.  Discussed which activities of doing which wants to avoid. Discussed possible visco  RTC in 6 weeks   09/12/2018 Monique [name] is a 73 y.o. female coming in with complaint of bilateral knee pain. States she would like to hold off on her injections. Not in a lot of pain today.  Patient does state overall 60% better.  No instability.  Fairly happy with the results at this moment.     Past Medical History:  Diagnosis Date  . Arthritis   . Heart murmur   . History of chicken pox   . Hyperlipidemia   . Hypertension   . Rheumatic fever    No past surgical history on file. Social History   Socioeconomic History  . Marital status: Legally Separated    Spouse name: Not on file  . Number of children: Not on file  . Years of education: Not on file  . Highest education level: Not on file  Occupational History  . Not on file  Social Needs  . Financial resource strain: Not on file  . Food insecurity    Worry: Not on file    Inability: Not on file  . Transportation needs    Medical: Not on file    Non-medical: Not on file  Tobacco Use  . Smoking status: Never Smoker  . Smokeless tobacco: Never Used  Substance and Sexual Activity  . Alcohol use: Yes  . Drug use: Never  . Sexual activity: Yes  Lifestyle  . Physical activity    Days per week: Not on file    Minutes per session: Not on file  . Stress: Not on file  Relationships  . Social Herbalist on phone: Not on file    Gets together: Not on file    Attends religious service: Not on file    Active member of club or  organization: Not on file    Attends meetings of clubs or organizations: Not on file    Relationship status: Not on file  Other Topics Concern  . Not on file  Social History Narrative  . Not on file   Allergies  Allergen Reactions  . Atorvastatin Other (See Comments)    Muscle aches   Family History  Problem Relation Age of Onset  . Alcohol abuse Mother   . Alcohol abuse Father      Current Outpatient Medications (Cardiovascular):  .  amLODipine (NORVASC) 10 MG tablet, Take 1 tablet (10 mg total) by mouth daily.   Current Outpatient Medications (Analgesics):  .  celecoxib (CELEBREX) 100 MG capsule, Take 1 capsule (100 mg total) by mouth 2 (two) times daily as needed.      Past medical history, social, surgical and family history all reviewed in electronic medical record.  No pertanent information unless stated regarding to the chief complaint.   Review of Systems:  No headache, visual changes, nausea, vomiting, diarrhea, constipation, dizziness, abdominal pain, skin rash, fevers, chills, night sweats, weight loss, swollen lymph nodes, body aches, joint swelling,  chest pain, shortness of breath, mood changes.  Positive muscle  aches  Objective  Blood pressure 110/82, pulse 78, height 5' 5.5" (1.664 m), weight 182 lb (82.6 kg), SpO2 98 %.    General: No apparent distress alert and oriented x3 mood and affect normal, dressed appropriately.  HEENT: Pupils equal, extraocular movements intact  Respiratory: Patient's speak in full sentences and does not appear short of breath  Cardiovascular: trace lower extremity edema, non tender, no erythema gets Skin: Warm dry intact with no signs of infection or rash on extremities or on axial skeleton.  Abdomen: Soft nontender  Neuro: Cranial nerves II through XII are intact, neurovascularly intact in all extremities with 2+ DTRs and 2+ pulses.  Lymph: No lymphadenopathy of posterior or anterior cervical chain or axillae bilaterally.   Gait mild antalgic  MSK:  tender with limited range of motion and good stability and symmetric strength and tone of shoulders, elbows, wrist, hip, and ankles bilaterally.  knee exam mild discomfort medial joint line and patellofemoral.  Still has some instability of the knee.  No effusion though noted.  Full range of motion.   Impression and Recommendations:     The above documentation has been reviewed and is accurate and complete Lyndal Pulley, DO       Note: This dictation was prepared with Dragon dictation along with smaller phrase technology. Any transcriptional errors that result from this process are unintentional.

## 2018-09-12 NOTE — Patient Instructions (Signed)
Keep it up  pennsaid pinkie amount topically 2 times daily as needed.  Ice 20 minutes 2 times daily. Usually after activity and before bed. Exercises 3 times a week.  Keep it up  Maybe see me again in 6-8 weeks

## 2018-09-14 ENCOUNTER — Encounter: Payer: Self-pay | Admitting: Family Medicine

## 2018-09-19 ENCOUNTER — Telehealth: Payer: Self-pay

## 2018-09-19 NOTE — Telephone Encounter (Signed)
Talked to patient about her sister Solmon Ice. States that she is having worsening pain. Suggested both Dr. Raeford Razor and Dr. Georgina Snell. Also told her we will handle miscommunication on her sister getting a stair lift.

## 2018-09-28 ENCOUNTER — Telehealth: Payer: Self-pay | Admitting: Family Medicine

## 2018-09-28 NOTE — Telephone Encounter (Signed)
I can find out more information after you find out what insurance she is changing to. It may be a contracting question instead of a credentialing question. Let me know what you find out.   Thanks,  [name]

## 2018-09-28 NOTE — Telephone Encounter (Signed)
I spoke with the patient earlier this week on 09/21/2018 regarding her concerns that her insurance will be changing on 10/09/2018 and Dr. Rogers Blocker was not in network with the new insurance. I am not sure who to contact with credentialing to look into this for the patient. I am waiting to hear back from Lea.  I know the patient has [employer] [place] and I left a voicemail for her to call back to tell me what it is changing to.

## 2018-10-01 NOTE — Telephone Encounter (Signed)
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 She states that insurance says Dr. Rogers Blocker is not in the network anymore. Please advise

## 2018-10-01 NOTE — Telephone Encounter (Signed)
Called and lvm for patient to return call

## 2018-10-01 NOTE — Telephone Encounter (Signed)
Patient is going to fax me the letter that she received from her insurance.

## 2018-10-24 ENCOUNTER — Ambulatory Visit: Payer: Medicare Other | Admitting: Family Medicine

## 2018-10-24 ENCOUNTER — Other Ambulatory Visit: Payer: Self-pay

## 2018-10-24 NOTE — Progress Notes (Deleted)
[name] [address], [place] [address] Phone: [phone] Subjective:     I'm seeing this patient by the request  of:    CC:   [id number]   09/12/2018 Doing better at this time.  Discussed icing regimen and home exercise.  No change in management.  Patient wants to hold on any viscosupplementation Monique [name] is a 73 y.o. female coming in with complaint of ***  Onset-  Location Duration-  Character- Aggravating factors- Reliving factors-  Therapies tried-  Severity-     Past Medical History:  Diagnosis Date  . Arthritis   . Heart murmur   . History of chicken pox   . Hyperlipidemia   . Hypertension   . Rheumatic fever    No past surgical history on file. Social History   Socioeconomic History  . Marital status: Legally Separated    Spouse name: Not on file  . Number of children: Not on file  . Years of education: Not on file  . Highest education level: Not on file  Occupational History  . Not on file  Social Needs  . Financial resource strain: Not on file  . Food insecurity    Worry: Not on file    Inability: Not on file  . Transportation needs    Medical: Not on file    Non-medical: Not on file  Tobacco Use  . Smoking status: Never Smoker  . Smokeless tobacco: Never Used  Substance and Sexual Activity  . Alcohol use: Yes  . Drug use: Never  . Sexual activity: Yes  Lifestyle  . Physical activity    Days per week: Not on file    Minutes per session: Not on file  . Stress: Not on file  Relationships  . Social Herbalist on phone: Not on file    Gets together: Not on file    Attends religious service: Not on file    Active member of club or organization: Not on file    Attends meetings of clubs or organizations: Not on file    Relationship status: Not on file  Other Topics Concern  . Not on file  Social History Narrative  . Not on file   Allergies  Allergen Reactions  .  Atorvastatin Other (See Comments)    Muscle aches   Family History  Problem Relation Age of Onset  . Alcohol abuse Mother   . Alcohol abuse Father      Current Outpatient Medications (Cardiovascular):  .  amLODipine (NORVASC) 10 MG tablet, Take 1 tablet (10 mg total) by mouth daily.   Current Outpatient Medications (Analgesics):  .  celecoxib (CELEBREX) 100 MG capsule, Take 1 capsule (100 mg total) by mouth 2 (two) times daily as needed.      Past medical history, social, surgical and family history all reviewed in electronic medical record.  No pertanent information unless stated regarding to the chief complaint.   Review of Systems:  No headache, visual changes, nausea, vomiting, diarrhea, constipation, dizziness, abdominal pain, skin rash, fevers, chills, night sweats, weight loss, swollen lymph nodes, body aches, joint swelling, muscle aches, chest pain, shortness of breath, mood changes.   Objective  There were no vitals taken for this visit. Systems examined below as of    General: No apparent distress alert and oriented x3 mood and affect normal, dressed appropriately.  HEENT: Pupils equal, extraocular movements intact  Respiratory: Patient's speak in full sentences and  does not appear short of breath  Cardiovascular: No lower extremity edema, non tender, no erythema  Skin: Warm dry intact with no signs of infection or rash on extremities or on axial skeleton.  Abdomen: Soft nontender  Neuro: Cranial nerves II through XII are intact, neurovascularly intact in all extremities with 2+ DTRs and 2+ pulses.  Lymph: No lymphadenopathy of posterior or anterior cervical chain or axillae bilaterally.  Gait normal with good balance and coordination.  MSK:  Non tender with full range of motion and good stability and symmetric strength and tone of shoulders, elbows, wrist, hip, knee and ankles bilaterally.     Impression and Recommendations:     This case required medical  decision making of moderate complexity. The above documentation has been reviewed and is accurate and complete Monique [name]       Note: This dictation was prepared with Dragon dictation along with smaller phrase technology. Any transcriptional errors that result from this process are unintentional.

## 2018-10-24 NOTE — Telephone Encounter (Signed)
I contacted the patient to updated that our [place] stated that Dr. Rogers Blocker is credentialed with [facility] Medicare, effective 06/04/17.  The patient said that she also called [employer] yesterday and they told her that the issue was resolved.  No further action required, no need to route note.

## 2018-12-19 ENCOUNTER — Ambulatory Visit (INDEPENDENT_AMBULATORY_CARE_PROVIDER_SITE_OTHER): Payer: Medicare Other

## 2018-12-19 ENCOUNTER — Other Ambulatory Visit: Payer: Self-pay

## 2018-12-19 DIAGNOSIS — Z0000 Encounter for general adult medical examination without abnormal findings: Secondary | ICD-10-CM

## 2018-12-19 NOTE — Patient Instructions (Signed)
Ms. Monique [name] , Thank you for taking time to come for your Medicare Wellness Visit. I appreciate your ongoing commitment to your health goals. Please review the following plan we discussed and let me know if I can assist you in the future.   Screening recommendations/referrals: Colorectal Screening: up to date; last 08/30/12 Mammogram: up to date; last 09/07/18 Bone Density: up to date; last 01/09/18  Vision and Dental Exams: Recommended annual ophthalmology exams for early detection of glaucoma and other disorders of the eye Recommended annual dental exams for proper oral hygiene  Vaccinations: Influenza vaccine:  recommended this fall either at PCP office or through your local pharmacy  Pneumococcal vaccine: recommended  Tdap vaccine: recommended  Shingles vaccine: Please call your insurance company to determine your out of pocket expense for the Shingrix vaccine. You may receive this vaccine at your local pharmacy.  Advanced directives: Advance directives discussed with you today. I have provided a copy for you to complete at home and have notarized. Once this is complete please bring a copy in to our office so we can scan it into your chart.  Goals: Recommend to drink at least 6-8 8oz glasses of water per day and consume a balanced diet rich in fresh fruits and vegetables.   Next appointment: Please schedule your Annual Wellness Visit with your Nurse Health Advisor in one year.  Preventive Care 65 Years and Older, Female Preventive care refers to lifestyle choices and visits with your health care provider that can promote health and wellness. What does preventive care include?  A yearly physical exam. This is also called an annual well check.  Dental exams once or twice a year.  Routine eye exams. Ask your health care provider how often you should have your eyes checked.  Personal lifestyle choices, including:  Daily care of your teeth and gums.  Regular physical activity.   Eating a healthy diet.  Avoiding tobacco and drug use.  Limiting alcohol use.  Practicing safe sex.  Taking low-dose aspirin every day if recommended by your health care provider.  Taking vitamin and mineral supplements as recommended by your health care provider. What happens during an annual well check? The services and screenings done by your health care provider during your annual well check will depend on your age, overall health, lifestyle risk factors, and family history of disease. Counseling  Your health care provider may ask you questions about your:  Alcohol use.  Tobacco use.  Drug use.  Emotional well-being.  Home and relationship well-being.  Sexual activity.  Eating habits.  History of falls.  Memory and ability to understand (cognition).  Work and work Statistician.  Reproductive health. Screening  You may have the following tests or measurements:  Height, weight, and BMI.  Blood pressure.  Lipid and cholesterol levels. These may be checked every 5 years, or more frequently if you are over 29 years old.  Skin check.  Lung cancer screening. You may have this screening every year starting at age 30 if you have a 30-pack-year history of smoking and currently smoke or have quit within the past 15 years.  Fecal occult blood test (FOBT) of the stool. You may have this test every year starting at age 26.  Flexible sigmoidoscopy or colonoscopy. You may have a sigmoidoscopy every 5 years or a colonoscopy every 10 years starting at age 58.  Hepatitis C blood test.  Hepatitis B blood test.  Sexually transmitted disease (STD) testing.  Diabetes screening. This is done  by checking your blood sugar (glucose) after you have not eaten for a while (fasting). You may have this done every 1-3 years.  Bone density scan. This is done to screen for osteoporosis. You may have this done starting at age 68.  Mammogram. This may be done every 1-2 years. Talk to  your health care provider about how often you should have regular mammograms. Talk with your health care provider about your test results, treatment options, and if necessary, the need for more tests. Vaccines  Your health care provider may recommend certain vaccines, such as:  Influenza vaccine. This is recommended every year.  Tetanus, diphtheria, and acellular pertussis (Tdap, Td) vaccine. You may need a Td booster every 10 years.  Zoster vaccine. You may need this after age 42.  Pneumococcal 13-valent conjugate (PCV13) vaccine. One dose is recommended after age 62.  Pneumococcal polysaccharide (PPSV23) vaccine. One dose is recommended after age 66. Talk to your health care provider about which screenings and vaccines you need and how often you need them. This information is not intended to replace advice given to you by your health care provider. Make sure you discuss any questions you have with your health care provider. Document Released: 02/20/2015 Document Revised: 10/14/2015 Document Reviewed: 11/25/2014 Elsevier Interactive Patient Education  2017 [place] Prevention in the Home Falls can cause injuries. They can happen to people of all ages. There are many things you can do to make your home safe and to help prevent falls. What can I do on the outside of my home?  Regularly fix the edges of walkways and driveways and fix any cracks.  Remove anything that might make you trip as you walk through a door, such as a raised step or threshold.  Trim any bushes or trees on the path to your home.  Use bright outdoor lighting.  Clear any walking paths of anything that might make someone trip, such as rocks or tools.  Regularly check to see if handrails are loose or broken. Make sure that both sides of any steps have handrails.  Any raised decks and porches should have guardrails on the edges.  Have any leaves, snow, or ice cleared regularly.  Use sand or salt on  walking paths during winter.  Clean up any spills in your garage right away. This includes oil or grease spills. What can I do in the bathroom?  Use night lights.  Install grab bars by the toilet and in the tub and shower. Do not use towel bars as grab bars.  Use non-skid mats or decals in the tub or shower.  If you need to sit down in the shower, use a plastic, non-slip stool.  Keep the floor dry. Clean up any water that spills on the floor as soon as it happens.  Remove soap buildup in the tub or shower regularly.  Attach bath mats securely with double-sided non-slip rug tape.  Do not have throw rugs and other things on the floor that can make you trip. What can I do in the bedroom?  Use night lights.  Make sure that you have a light by your bed that is easy to reach.  Do not use any sheets or blankets that are too big for your bed. They should not hang down onto the floor.  Have a firm chair that has side arms. You can use this for support while you get dressed.  Do not have throw rugs and other things on  the floor that can make you trip. What can I do in the kitchen?  Clean up any spills right away.  Avoid walking on wet floors.  Keep items that you use a lot in easy-to-reach places.  If you need to reach something above you, use a strong step stool that has a grab bar.  Keep electrical cords out of the way.  Do not use floor polish or wax that makes floors slippery. If you must use wax, use non-skid floor wax.  Do not have throw rugs and other things on the floor that can make you trip. What can I do with my stairs?  Do not leave any items on the stairs.  Make sure that there are handrails on both sides of the stairs and use them. Fix handrails that are broken or loose. Make sure that handrails are as long as the stairways.  Check any carpeting to make sure that it is firmly attached to the stairs. Fix any carpet that is loose or worn.  Avoid having throw  rugs at the top or bottom of the stairs. If you do have throw rugs, attach them to the floor with carpet tape.  Make sure that you have a light switch at the top of the stairs and the bottom of the stairs. If you do not have them, ask someone to add them for you. What else can I do to help prevent falls?  Wear shoes that:  Do not have high heels.  Have rubber bottoms.  Are comfortable and fit you well.  Are closed at the toe. Do not wear sandals.  If you use a stepladder:  Make sure that it is fully opened. Do not climb a closed stepladder.  Make sure that both sides of the stepladder are locked into place.  Ask someone to hold it for you, if possible.  Clearly mark and make sure that you can see:  Any grab bars or handrails.  First and last steps.  Where the edge of each step is.  Use tools that help you move around (mobility aids) if they are needed. These include:  Canes.  Walkers.  Scooters.  Crutches.  Turn on the lights when you go into a dark area. Replace any light bulbs as soon as they burn out.  Set up your furniture so you have a clear path. Avoid moving your furniture around.  If any of your floors are uneven, fix them.  If there are any pets around you, be aware of where they are.  Review your medicines with your doctor. Some medicines can make you feel dizzy. This can increase your chance of falling. Ask your doctor what other things that you can do to help prevent falls. This information is not intended to replace advice given to you by your health care provider. Make sure you discuss any questions you have with your health care provider. Document Released: 11/20/2008 Document Revised: 07/02/2015 Document Reviewed: 02/28/2014 Elsevier Interactive Patient Education  2017 [employer].

## 2018-12-19 NOTE — Progress Notes (Signed)
I connected with Monique [name] on 12/19/18 at 1130 by phone and verified that I am speaking with the correct person using two identifiers. Location patient: Home Location provider: [facility], Office Persons participating in the virtual visit: Denman George LPN and Dr. [name]    I discussed the limitations of evaluation and management by telemedicine and the availability of in person appointments. The patient expressed understanding and agreed to proceed.  Subjective:   Monique [name] is a 73 y.o. female who presents for an Subsequent Medicare Annual Wellness Visit.  Review of Systems     Cardiac Risk Factors include: advanced age (>39men, >82 women);hypertension    Objective:    There were no vitals filed for this visit. There is no height or weight on file to calculate BMI.  Advanced Directives 12/19/2018  Does Patient Have a Medical Advance Directive? No  Would patient like information on creating a medical advance directive? Yes (MAU/Ambulatory/Procedural Areas - Information given)    Current Medications (verified) Outpatient Encounter Medications as of 12/19/2018  Medication Sig  . amLODipine (NORVASC) 10 MG tablet Take 1 tablet (10 mg total) by mouth daily.  . [DISCONTINUED] celecoxib (CELEBREX) 100 MG capsule Take 1 capsule (100 mg total) by mouth 2 (two) times daily as needed.   No facility-administered encounter medications on file as of 12/19/2018.     Allergies (verified) Atorvastatin   History: Past Medical History:  Diagnosis Date  . Arthritis   . Heart murmur   . History of chicken pox   . Hyperlipidemia   . Hypertension   . Rheumatic fever    History reviewed. No pertinent surgical history. Family History  Problem Relation Age of Onset  . Alcohol abuse Mother   . Alcohol abuse Father    Social History   Socioeconomic History  . Marital status: Legally Separated    Spouse name: Not on file  . Number of children: Not on file  .  Years of education: Not on file  . Highest education level: Not on file  Occupational History  . Not on file  Social Needs  . Financial resource strain: Not on file  . Food insecurity    Worry: Not on file    Inability: Not on file  . Transportation needs    Medical: Not on file    Non-medical: Not on file  Tobacco Use  . Smoking status: Never Smoker  . Smokeless tobacco: Never Used  Substance and Sexual Activity  . Alcohol use: Yes  . Drug use: Never  . Sexual activity: Yes  Lifestyle  . Physical activity    Days per week: Not on file    Minutes per session: Not on file  . Stress: Not on file  Relationships  . Social Herbalist on phone: Not on file    Gets together: Not on file    Attends religious service: Not on file    Active member of club or organization: Not on file    Attends meetings of clubs or organizations: Not on file    Relationship status: Not on file  Other Topics Concern  . Not on file  Social History Narrative  . Not on file    Tobacco Counseling Counseling given: Not Answered   Clinical Intake:  Pre-visit preparation completed: Yes  Pain : No/denies pain  Diabetes: No  How often do you need to have someone help you when you read instructions, pamphlets, or other written materials from  your doctor or pharmacy?: 1 - Never  Interpreter Needed?: No  Information entered by :: Denman George LPN   Activities of Daily Living In your present state of health, do you have any difficulty performing the following activities: 12/19/2018  Hearing? N  Vision? N  Difficulty concentrating or making decisions? N  Walking or climbing stairs? N  Dressing or bathing? N  Doing errands, shopping? N  Preparing Food and eating ? N  Using the Toilet? N  In the past six months, have you accidently leaked urine? N  Do you have problems with loss of bowel control? N  Managing your Medications? N  Managing your Finances? N  Housekeeping or  managing your Housekeeping? N  Some recent data might be hidden     Immunizations and Health Maintenance  There is no immunization history on file for this patient. Health Maintenance Due  Topic Date Due  . TETANUS/TDAP  07/26/1966  . COLONOSCOPY  07/25/1997  . INFLUENZA VACCINE  09/08/2018    Patient Care Team: [name], MD as PCP - General (Family Medicine) Gerda Diss, DO as Consulting Physician (Sports Medicine)  Indicate any recent Medical Services you may have received from other than [facility] providers in the past year (date may be approximate).     Assessment:   This is a routine wellness examination for Monique [name].  Hearing/Vision screen No exam data present  Dietary issues and exercise activities discussed: Current Exercise Habits: The patient does not participate in regular exercise at present  Goals   None    Depression Screen PHQ 2/9 Scores 12/19/2018 01/24/2018  PHQ - 2 Score 0 0    Fall Risk Fall Risk  12/19/2018  Falls in the past year? 0  Injury with Fall? 0  Follow up Falls evaluation completed;Education provided;Falls prevention discussed    Is the patient's home free of loose throw rugs in walkways, pet beds, electrical cords, etc?   yes      Grab bars in the bathroom? yes      Handrails on the stairs?   yes      Adequate lighting?   yes  Cognitive Function: no cognitive concerns at this time   Alert? Yes         Normal Appearance? n/a Oriented to person? Yes           Place? Yes  Time? Yes  Recall of three objects? Yes  Can perform simple calculations? Yes  Displays appropriate judgment? Yes  Can read the correct time from a watch face? Yes   Screening Tests Health Maintenance  Topic Date Due  . TETANUS/TDAP  07/26/1966  . COLONOSCOPY  07/25/1997  . INFLUENZA VACCINE  09/08/2018  . PNA vac Low Risk Adult (1 of 2 - PCV13) 01/25/2019 (Originally 07/25/2012)  . MAMMOGRAM  09/06/2020  . DEXA SCAN  Completed  . Hepatitis C  Screening  Completed    Qualifies for Shingles Vaccine? Discussed and patient will check with pharmacy for coverage.  Patient education handout provided    Cancer Screenings: Lung: Low Dose CT Chest recommended if Age 33-80 years, 30 pack-year currently smoking OR have quit w/in 15years. Patient does not qualify. Breast: Up to date on Mammogram? Yes   Up to date of Bone Density/Dexa? Yes Colorectal: colonoscopy 08/30/12 with Dr. Mathews Robinsons  Plan:  I have personally reviewed and addressed the Medicare Annual Wellness questionnaire and have noted the following in the patient's chart:  A. Medical and social history  B. Use of alcohol, tobacco or illicit drugs  C. Current medications and supplements D. Functional ability and status E.  Nutritional status F.  Physical activity G. Advance directives H. List of other physicians I.  Hospitalizations, surgeries, and ER visits in previous 12 months J.  Eleva such as hearing and vision if needed, cognitive and depression L. Referrals, records requested, and appointments- none   In addition, I have reviewed and discussed with patient certain preventive protocols, quality metrics, and best practice recommendations. A written personalized care plan for preventive services as well as general preventive health recommendations were provided to patient.   Signed,  Denman George, LPN  Nurse Health Advisor   Nurse Notes: no additional

## 2018-12-19 NOTE — Progress Notes (Signed)
I have reviewed the documentation from the recent AWV done by Courtney Slade; I agree with the documentation and will follow up on any recommendations or abnormal findings as suggested. Yani Coventry, MD [facility]    

## 2019-03-19 ENCOUNTER — Other Ambulatory Visit: Payer: Self-pay | Admitting: Family Medicine

## 2019-03-19 NOTE — Telephone Encounter (Signed)
PT needs appt for refills

## 2019-05-09 ENCOUNTER — Encounter: Payer: Self-pay | Admitting: Nurse Practitioner

## 2019-05-27 ENCOUNTER — Ambulatory Visit: Payer: Medicare Other | Admitting: Nurse Practitioner

## 2019-05-27 ENCOUNTER — Encounter: Payer: Self-pay | Admitting: Nurse Practitioner

## 2019-05-27 VITALS — BP 120/110 | HR 81 | Temp 98.0°F | Ht 65.0 in | Wt 178.0 lb

## 2019-05-27 DIAGNOSIS — R195 Other fecal abnormalities: Secondary | ICD-10-CM | POA: Diagnosis not present

## 2019-05-27 MED ORDER — NA SULFATE-K SULFATE-MG SULF 17.5-3.13-1.6 GM/177ML PO SOLN: 1.0000 | Freq: Once | ORAL | 0 refills | Status: AC

## 2019-05-27 NOTE — Patient Instructions (Signed)
If you are age 72 or older, your body mass index should be between 23-30. Your Body mass index is 29.62 kg/m. If this is out of the aforementioned range listed, please consider follow up with your Primary Care Provider.  If you are age 31 or younger, your body mass index should be between 19-25. Your Body mass index is 29.62 kg/m. If this is out of the aformentioned range listed, please consider follow up with your Primary Care Provider.   You have been scheduled for a colonoscopy. Please follow written instructions given to you at your visit today.  Please pick up your prep supplies at the pharmacy within the next 1-3 days. If you use inhalers (even only as needed), please bring them with you on the day of your procedure.

## 2019-05-27 NOTE — Progress Notes (Signed)
I agree with the above note, plan 

## 2019-05-27 NOTE — Progress Notes (Signed)
              ASSESSMENT / PLAN:   74 year old female with PMH significant for hypertension, hyperlipidemia and vitamin D deficiency, Rheumatic fever, arthritis   # Positive fecal immunoassay --For further evaluation patient will be scheduled for colonoscopy.The risks and benefits of colonoscopy with possible polypectomy / biopsies were discussed and the patient agrees to proceed.   HPI:     Chief Complaint: Positive FIT     Monique [name] is a 73 year old female referred for positive Cologuard.  Patient has no overt bleeding.  No abdominal pain, nausea, vomiting, no unexplained weight loss.  No family history of colon cancer.  She says her last colonoscopy was approximately 7 years ago in [place], apparently no polyps found.    Past Medical History:  Diagnosis Date  . Arthritis   . Heart murmur   . History of chicken pox   . Hyperlipidemia   . Hypertension   . Rheumatic fever      History reviewed. No pertinent surgical history. Family History  Problem Relation Age of Onset  . Alcohol abuse Mother   . Alcohol abuse Father    Social History   Tobacco Use  . Smoking status: Never Smoker  . Smokeless tobacco: Never Used  Substance Use Topics  . Alcohol use: Yes  . Drug use: Never   Current Outpatient Medications  Medication Sig Dispense Refill  . metoprolol succinate (TOPROL-XL) 25 MG 24 hr tablet Take 1 tablet by mouth daily.    Marland Kitchen amLODipine (NORVASC) 10 MG tablet Take 1 tablet (10 mg total) by mouth daily. (Patient not taking: Reported on 05/27/2019) 90 tablet 3   No current facility-administered medications for this visit.   Allergies  Allergen Reactions  . Atorvastatin Other (See Comments)    Muscle aches     Review of Systems: Positive for arthritis.  All other systems reviewed and negative except where noted in HPI.   Creatinine clearance cannot be calculated (Patient's most recent lab result is older than the maximum 21 days  allowed.)   Physical Exam:    Wt Readings from Last 3 Encounters:  05/27/19 178 lb (80.7 kg)  09/12/18 182 lb (82.6 kg)  08/31/18 184 lb 6.4 oz (83.6 kg)    BP (!) 120/110 (BP Location: Left Arm, Patient Position: Sitting, Cuff Size: Normal) Comment: Per pt bottom number has been a little high last few days.  Pulse 81   Temp 98 F (36.7 C)   Ht 5\' 5"  (1.651 m)   Wt 178 lb (80.7 kg)   LMP  (LMP Unknown)   BMI 29.62 kg/m  Constitutional:  Pleasant female in no acute distress. Psychiatric: Normal mood and affect. Behavior is normal. EENT: Pupils normal.  Conjunctivae are normal. No scleral icterus. Neck supple.  Cardiovascular: Normal rate, regular rhythm. No edema Pulmonary/chest: Effort normal and breath sounds normal. No wheezing, rales or rhonchi. Abdominal: Soft, nondistended, nontender. Bowel sounds active throughout. There are no masses palpable. No hepatomegaly. Neurological: Alert and oriented to person place and time. Skin: Skin is warm and dry. No rashes noted.  Tye Savoy, NP  05/27/2019, 10:29 AM  Cc:  Referring Provider Buzzy Han, MD

## 2019-06-03 ENCOUNTER — Telehealth: Payer: Self-pay | Admitting: Gastroenterology

## 2019-06-03 ENCOUNTER — Encounter: Payer: Medicare Other | Admitting: Gastroenterology

## 2019-06-03 NOTE — Telephone Encounter (Signed)
Spoke with pt and she would like new prep instructions sent to her.  She has had both doses of the covid vaccine

## 2019-06-12 IMAGING — CR DG SHOULDER 2+V*L*
3 series · 3 of 3 positions shown · non-contrast
Comparison: None.

CLINICAL DATA: Chronic left shoulder pain.  No known injury.

EXAM:
LEFT SHOULDER - 2+ VIEW

[w shoulder grashey left]
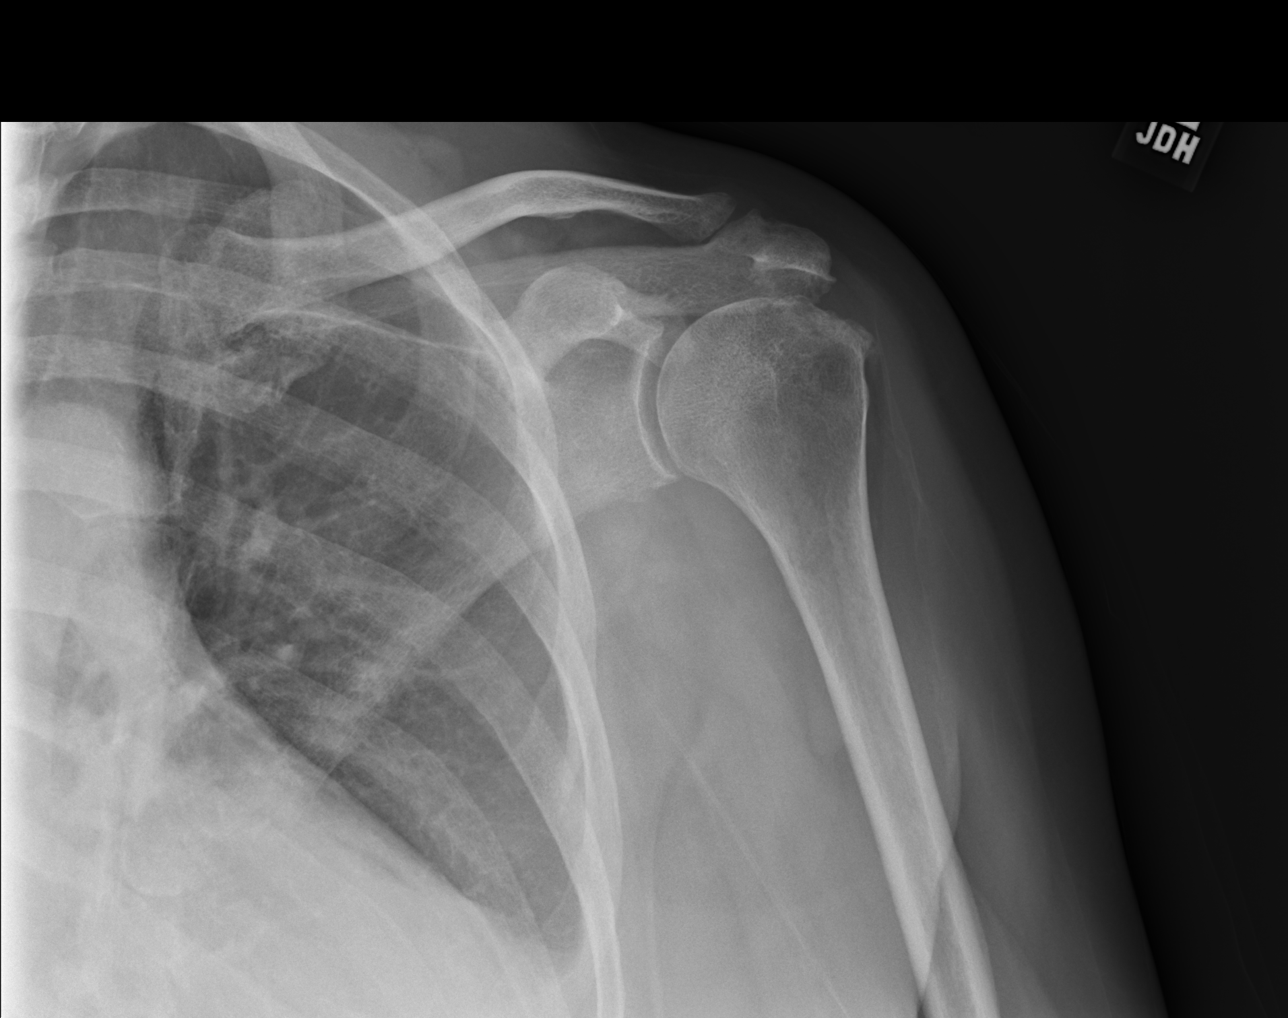

[w shoulder y-view left]
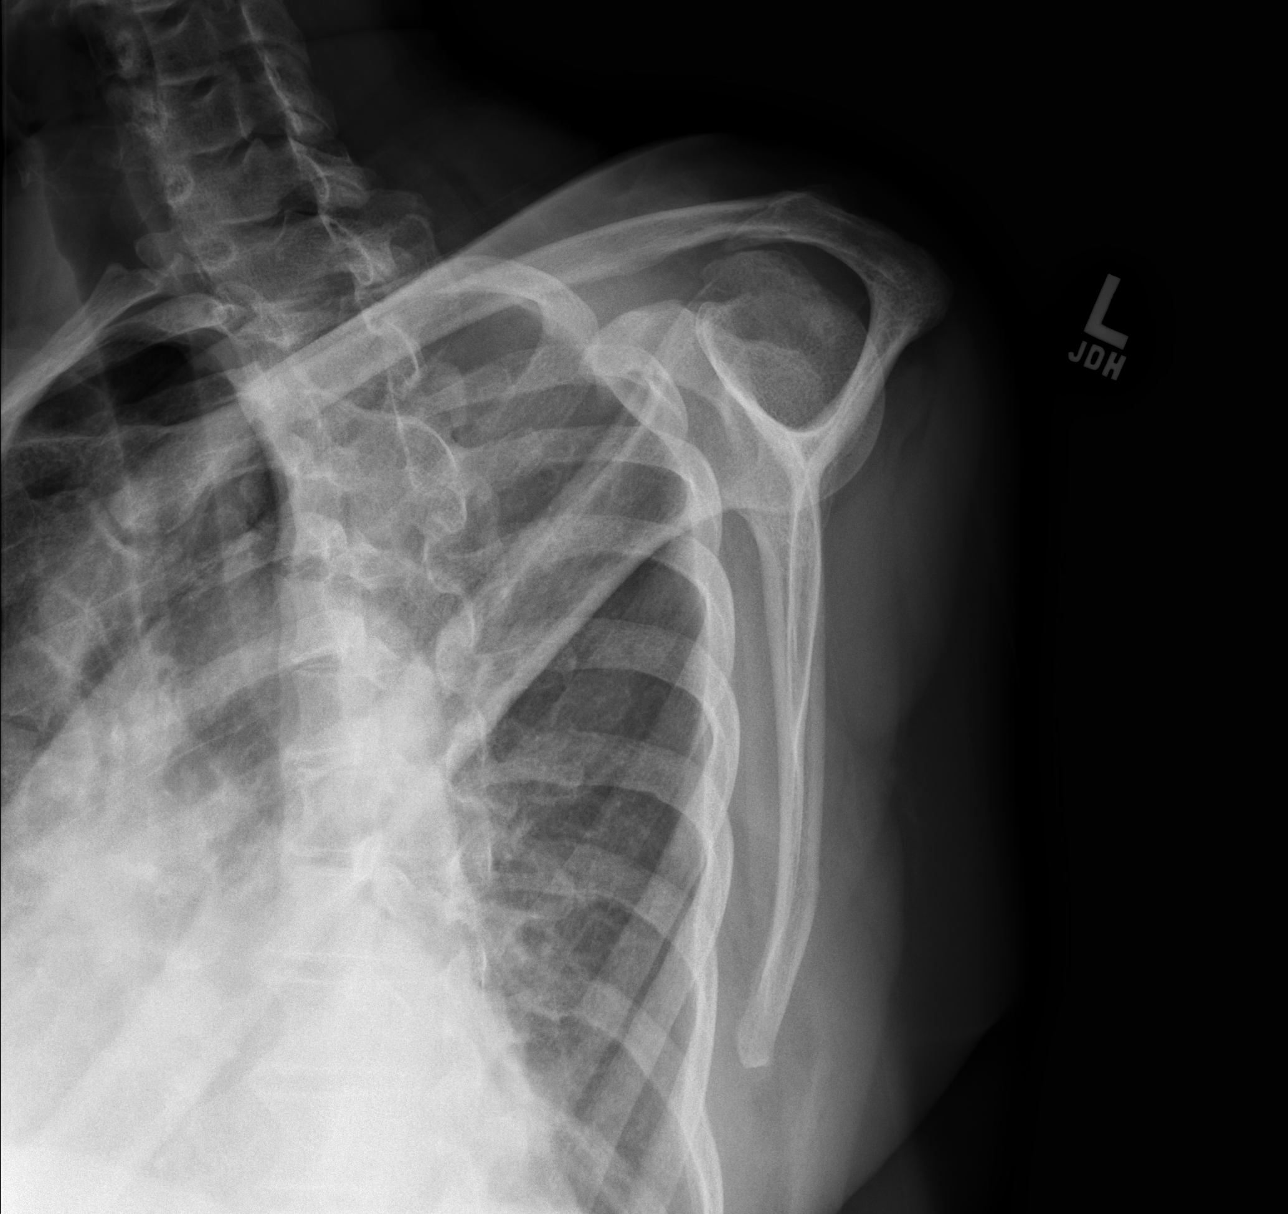

[w shoulder axillary left]
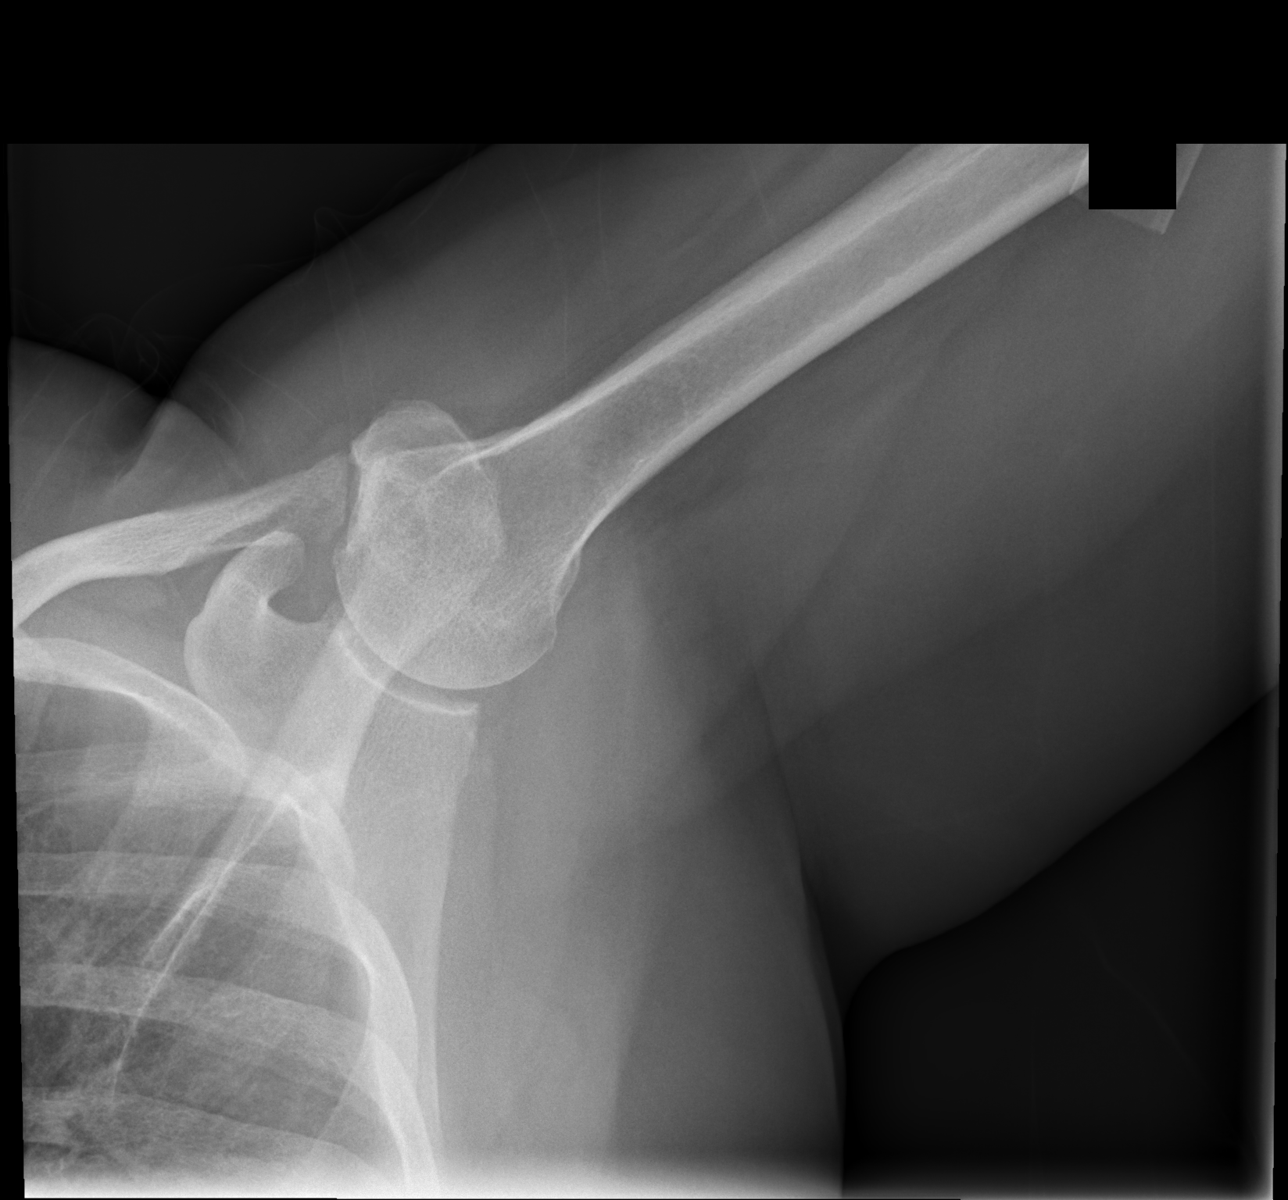

[3 of 3 positions shown; findings below may reference images not displayed]

FINDINGS: No acute bony or joint abnormality is identified. There is mild to
moderate acromioclavicular osteoarthritis. Subacromial spurring is
seen. Sclerosis and irregularity of the greater tuberosity are
noted.
IMPRESSION: No acute finding.

Mild to moderate acromioclavicular osteoarthritis. Subacromial
spurring also seen.

Appearance of the greater tuberosity suggestive of rotator cuff
tendinopathy.

## 2019-06-12 IMAGING — CR DG KNEE 1-2V*L*
2 series · 2 of 2 positions shown · non-contrast
Comparison: No recent.

CLINICAL DATA: Bilateral knee pain.  DJD.

EXAM:
LEFT KNEE - 1-2 VIEW

[w knee ap left]
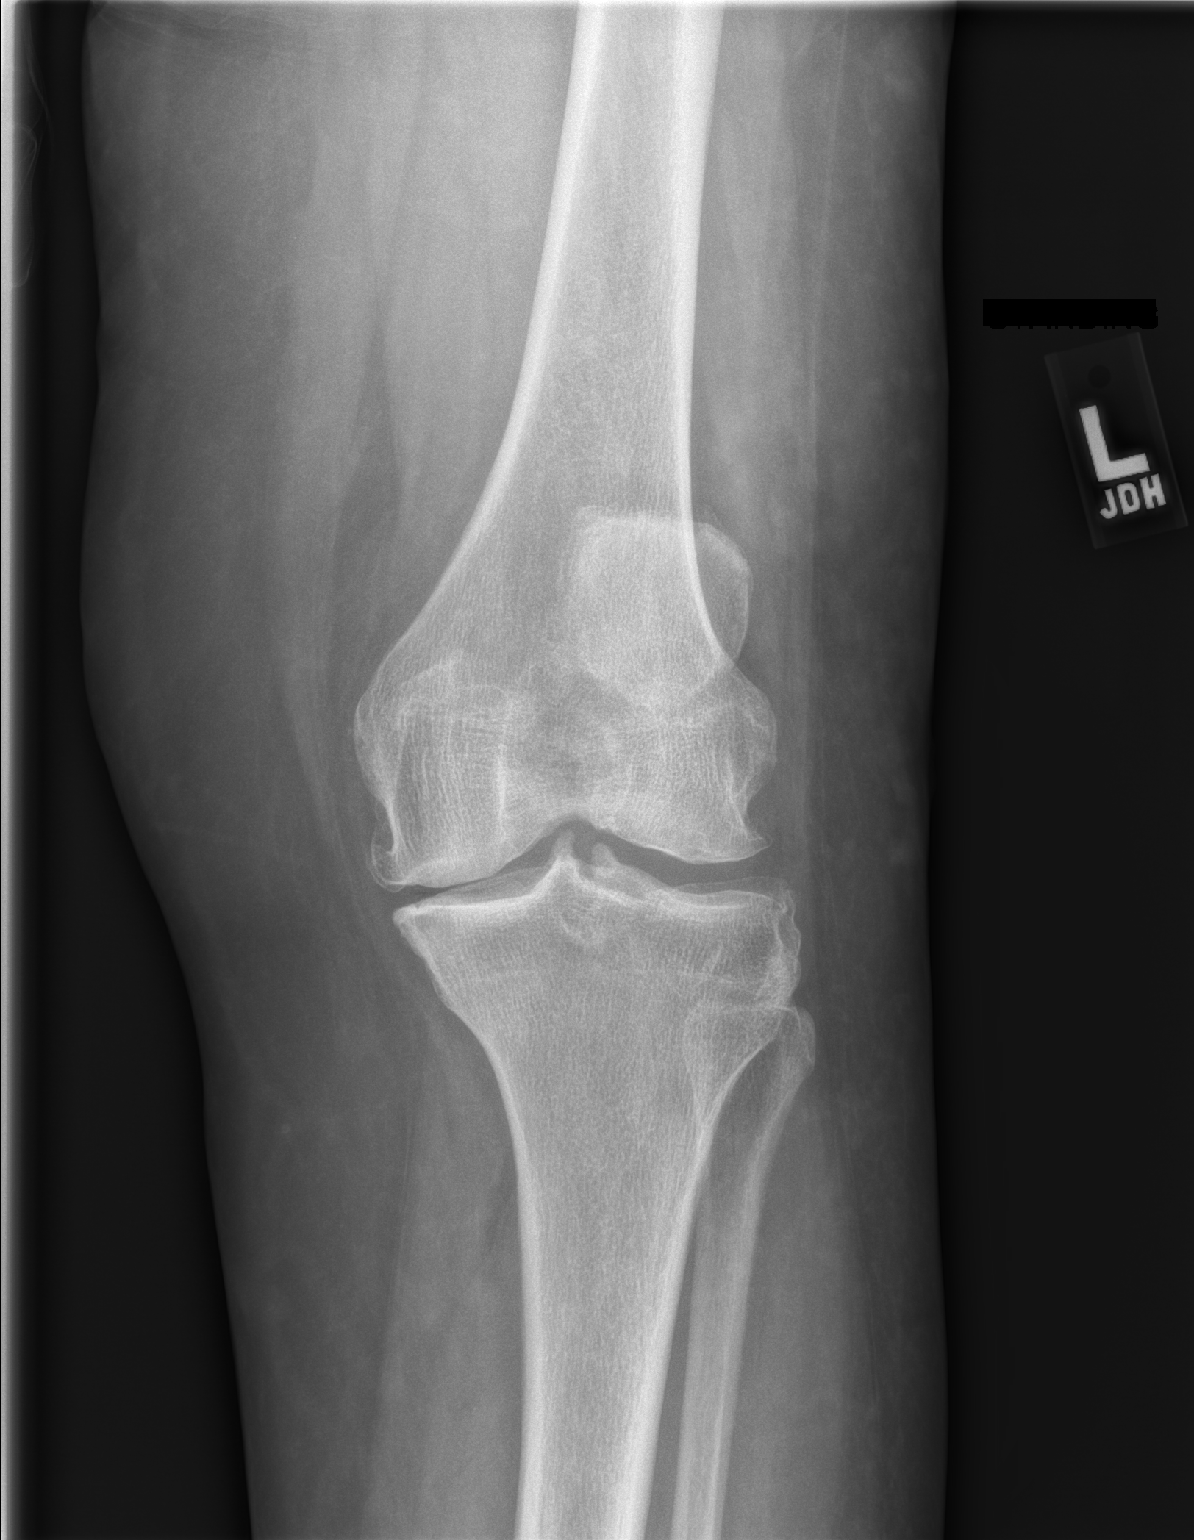

[w knee lat left]
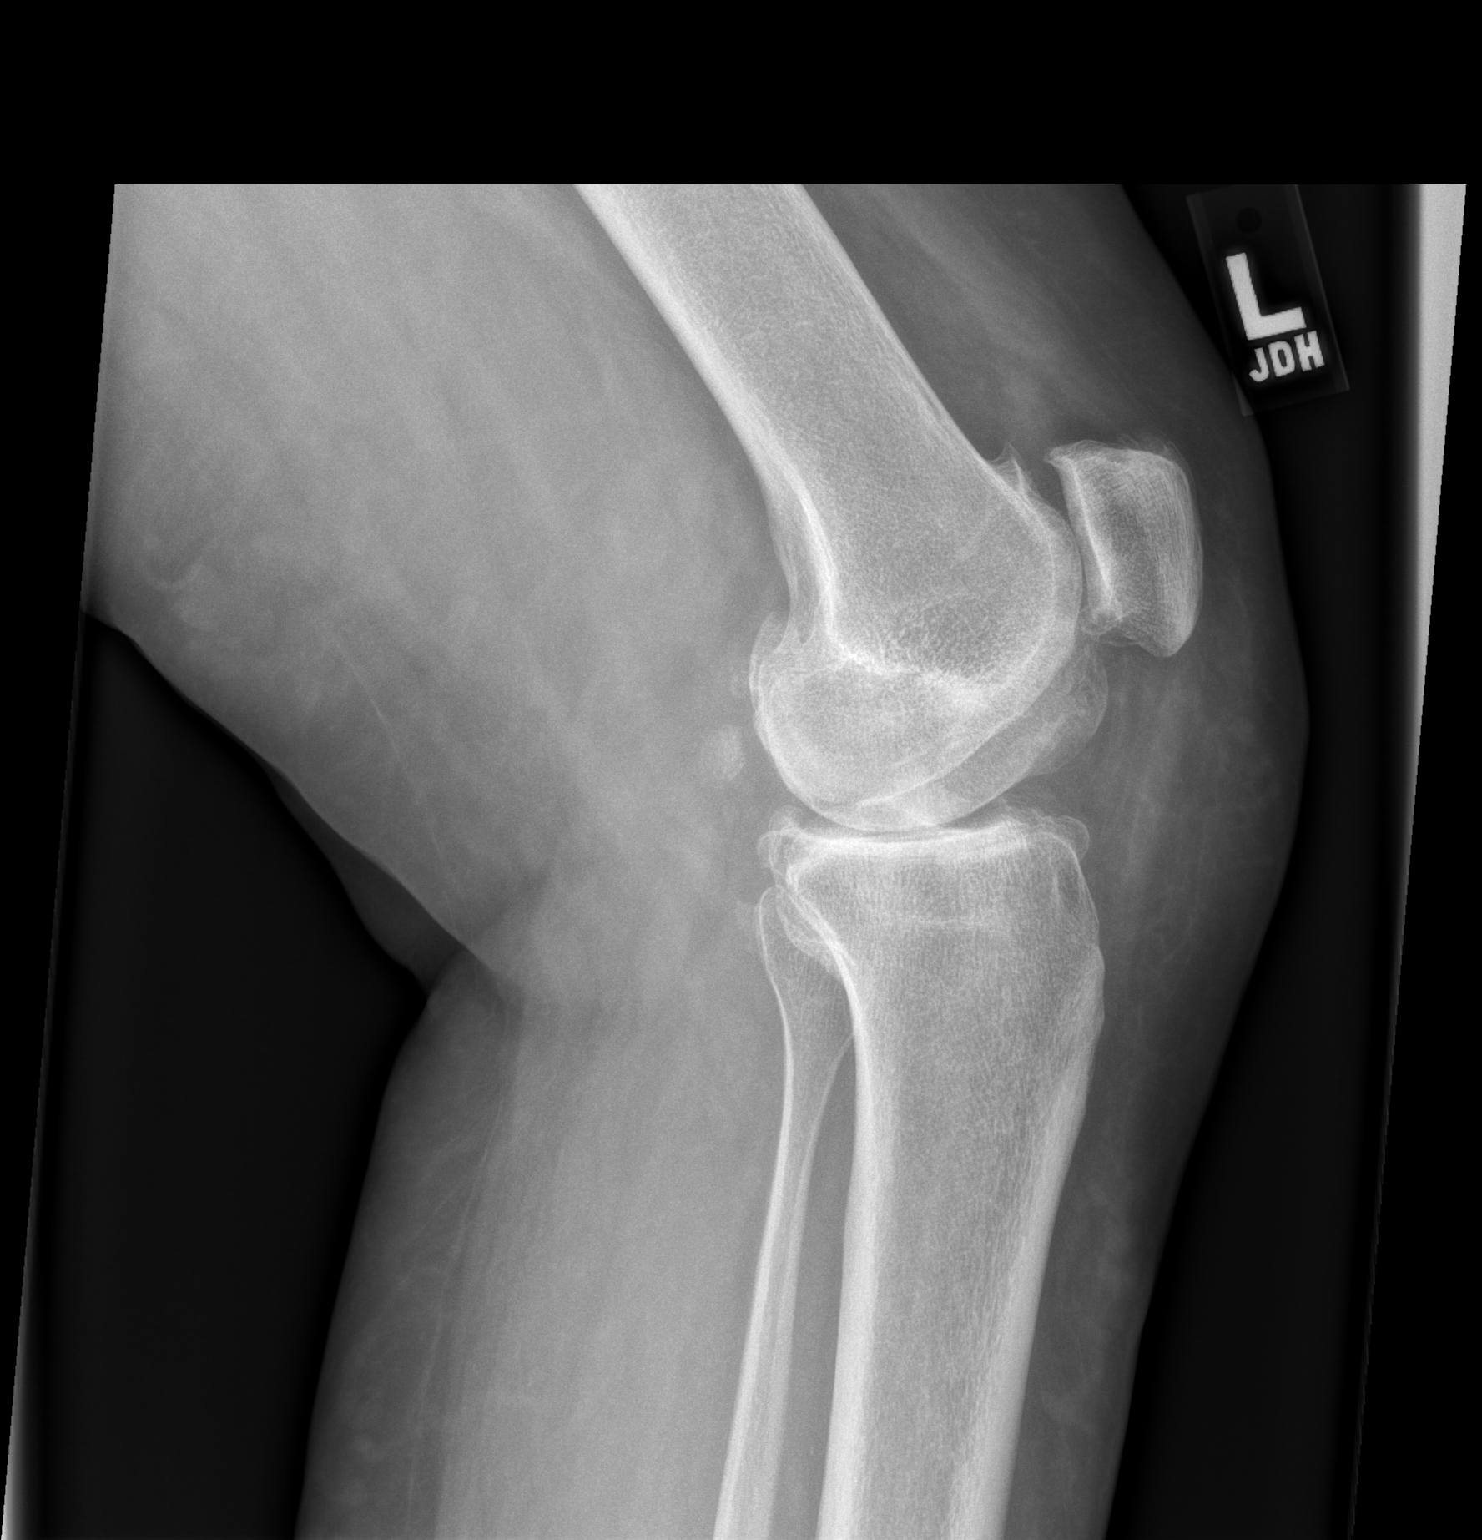

[2 of 2 positions shown; findings below may reference images not displayed]

FINDINGS: Tricompartment degenerative change. No acute bony or joint
abnormality. Mild lateral subluxation of the tibia. This is most
likely degenerative. Small knee joint effusion.
IMPRESSION: Severe tricompartment degenerative change. Mild lateral subluxation
of the tibia. This is most likely degenerative. No acute bony
abnormality. Small knee joint effusion.

## 2019-07-10 ENCOUNTER — Encounter: Payer: Self-pay | Admitting: Gastroenterology

## 2019-07-10 ENCOUNTER — Ambulatory Visit (AMBULATORY_SURGERY_CENTER): Payer: Medicare Other | Admitting: Gastroenterology

## 2019-07-10 ENCOUNTER — Other Ambulatory Visit: Payer: Self-pay

## 2019-07-10 VITALS — BP 120/64 | HR 68 | Temp 96.9°F | Resp 16 | Ht 65.0 in | Wt 178.0 lb

## 2019-07-10 DIAGNOSIS — R195 Other fecal abnormalities: Secondary | ICD-10-CM

## 2019-07-10 DIAGNOSIS — D120 Benign neoplasm of cecum: Secondary | ICD-10-CM

## 2019-07-10 DIAGNOSIS — K5730 Diverticulosis of large intestine without perforation or abscess without bleeding: Secondary | ICD-10-CM | POA: Diagnosis not present

## 2019-07-10 MED ORDER — SODIUM CHLORIDE 0.9 % IV SOLN: 500.0000 mL | Freq: Once | INTRAVENOUS | Status: DC

## 2019-07-10 NOTE — Progress Notes (Signed)
Called to room to assist during endoscopic procedure.  Patient ID and intended procedure confirmed with present staff. Received instructions for my participation in the procedure from the performing physician.  

## 2019-07-10 NOTE — Progress Notes (Signed)
Report to PACU, RN, vss, BBS= Clear.  

## 2019-07-10 NOTE — Progress Notes (Signed)
Pt's states no medical or surgical changes since previsit or office visit. 

## 2019-07-10 NOTE — Op Note (Signed)
[place] Patient Name: Monique [name] Procedure Date: 07/10/2019 2:36 PM MRN: [id number] Endoscopist: Milus Banister , MD Age: 74 Referring MD:  Date of Birth: 1945-07-02 Gender: Female Account #: [id number] Procedure:                Colonoscopy Indications:              Positive fecal immunochemical test Medicines:                Monitored Anesthesia Care Procedure:                Pre-Anesthesia Assessment:                           - Prior to the procedure, a History and Physical                            was performed, and patient medications and                            allergies were reviewed. The patient's tolerance of                            previous anesthesia was also reviewed. The risks                            and benefits of the procedure and the sedation                            options and risks were discussed with the patient.                            All questions were answered, and informed consent                            was obtained. Prior Anticoagulants: The patient has                            taken no previous anticoagulant or antiplatelet                            agents. ASA Grade Assessment: III - A patient with                            severe systemic disease. After reviewing the risks                            and benefits, the patient was deemed in                            satisfactory condition to undergo the procedure.                           After obtaining informed consent, the colonoscope  was passed under direct vision. Throughout the                            procedure, the patient's blood pressure, pulse, and                            oxygen saturations were monitored continuously. The                            Colonoscope was introduced through the anus and                            advanced to the the cecum, identified by                            appendiceal orifice and  ileocecal valve. The                            colonoscopy was performed without difficulty. The                            patient tolerated the procedure well. The quality                            of the bowel preparation was good. The ileocecal                            valve, appendiceal orifice, and rectum were                            photographed. Scope In: [id number] PM Scope Out: 3:00:28 PM Scope Withdrawal Time: 0 hours 10 minutes 47 seconds  Total Procedure Duration: 0 hours 14 minutes 10 seconds  Findings:                 A 5 mm polyp was found in the cecum. The polyp was                            sessile. The polyp was removed with a cold snare.                            Resection and retrieval were complete.                           Multiple small and large-mouthed diverticula were                            found in the left colon.                           The exam was otherwise without abnormality on                            direct and retroflexion views. Complications:  No immediate complications. Estimated blood loss:                            None. Estimated Blood Loss:     Estimated blood loss: none. Impression:               - One 5 mm polyp in the cecum, removed with a cold                            snare. Resected and retrieved.                           - Diverticulosis in the left colon.                           - The examination was otherwise normal on direct                            and retroflexion views. Recommendation:           - Patient has a contact number available for                            emergencies. The signs and symptoms of potential                            delayed complications were discussed with the                            patient. Return to normal activities tomorrow.                            Written discharge instructions were provided to the                            patient.                           -  Resume previous diet.                           - Continue present medications.                           - Await pathology results. Milus Banister, MD 07/10/2019 3:03:37 PM This report has been signed electronically.

## 2019-07-10 NOTE — Patient Instructions (Signed)
Thank you for allowing [place] to care for you today.  Await pathology result of polyps removed.  Will make recommendation for next colonoscopy at that time.  Resume previous diet and medications today.  Return to your normal activities tomorrow.    YOU HAD AN ENDOSCOPIC PROCEDURE TODAY AT [facility]:   Refer to the procedure report that was given to you for any specific questions about what was found during the examination.  If the procedure report does not answer your questions, please call your gastroenterologist to clarify.  If you requested that your care partner not be given the details of your procedure findings, then the procedure report has been included in a sealed envelope for you to review at your convenience later.  YOU SHOULD EXPECT: Some feelings of bloating in the abdomen. Passage of more gas than usual.  Walking can help get rid of the air that was put into your GI tract during the procedure and reduce the bloating. If you had a lower endoscopy (such as a colonoscopy or flexible sigmoidoscopy) you may notice spotting of blood in your stool or on the toilet paper. If you underwent a bowel prep for your procedure, you may not have a normal bowel movement for a few days.  Please Note:  You might notice some irritation and congestion in your nose or some drainage.  This is from the oxygen used during your procedure.  There is no need for concern and it should clear up in a day or so.  SYMPTOMS TO REPORT IMMEDIATELY:   Following lower endoscopy (colonoscopy or flexible sigmoidoscopy):  Excessive amounts of blood in the stool  Significant tenderness or worsening of abdominal pains  Swelling of the abdomen that is new, acute  Fever of 100F or higher   For urgent or emergent issues, a gastroenterologist can be reached at any hour by calling [phone]. Do not use MyChart messaging for urgent concerns.    DIET:  We do recommend a small meal at first, but then you  may proceed to your regular diet.  Drink plenty of fluids but you should avoid alcoholic beverages for 24 hours.  ACTIVITY:  You should plan to take it easy for the rest of today and you should NOT DRIVE or use heavy machinery until tomorrow (because of the sedation medicines used during the test).    FOLLOW UP: Our staff will call the number listed on your records 48-72 hours following your procedure to check on you and address any questions or concerns that you may have regarding the information given to you following your procedure. If we do not reach you, we will leave a message.  We will attempt to reach you two times.  During this call, we will ask if you have developed any symptoms of COVID 19. If you develop any symptoms (ie: fever, flu-like symptoms, shortness of breath, cough etc.) before then, please call [phone].  If you test positive for Covid 19 in the 2 weeks post procedure, please call and report this information to [place].    If any biopsies were taken you will be contacted by phone or by letter within the next 1-3 weeks.  Please call us at [phone] if you have not heard about the biopsies in 3 weeks.    SIGNATURES/CONFIDENTIALITY: You and/or your care partner have signed paperwork which will be entered into your electronic medical record.  These signatures attest to the fact that that the information above on  your After Visit Summary has been reviewed and is understood.  Full responsibility of the confidentiality of this discharge information lies with you and/or your care-partner. 

## 2019-07-12 ENCOUNTER — Telehealth: Payer: Self-pay

## 2019-07-12 NOTE — Telephone Encounter (Signed)
  Follow up Call-  Call back number 07/10/2019  Post procedure Call Back phone  # [phone]  Permission to leave phone message Yes     Patient questions:  Do you have a fever, pain , or abdominal swelling? No. Pain Score  0 *  Have you tolerated food without any problems? Yes.    Have you been able to return to your normal activities? Yes.    Do you have any questions about your discharge instructions: Diet   No. Medications  No. Follow up visit  No.  Do you have questions or concerns about your Care? No.  Actions: * If pain score is 4 or above: No action needed, pain <4.  1. Have you developed a fever since your procedure? no  2.   Have you had an respiratory symptoms (SOB or cough) since your procedure? no  3.   Have you tested positive for COVID 19 since your procedure no  4.   Have you had any family members/close contacts diagnosed with the COVID 19 since your procedure?  no   If yes to any of these questions please route to Joylene John, RN and Erenest Rasher, RN

## 2019-07-17 ENCOUNTER — Encounter: Payer: Self-pay | Admitting: Gastroenterology
